# Patient Record
Sex: Female | Born: 1950 | Race: White | Hispanic: No | Marital: Single | Smoking: Never smoker
Health system: Southern US, Community
[De-identification: ages and names within clinical notes are randomized; demographics above are authoritative.]

## PROBLEM LIST (undated history)

## (undated) DIAGNOSIS — E785 Hyperlipidemia, unspecified: Secondary | ICD-10-CM

## (undated) DIAGNOSIS — T7840XA Allergy, unspecified, initial encounter: Secondary | ICD-10-CM

## (undated) DIAGNOSIS — I639 Cerebral infarction, unspecified: Secondary | ICD-10-CM

## (undated) DIAGNOSIS — S0300XA Dislocation of jaw, unspecified side, initial encounter: Secondary | ICD-10-CM

## (undated) DIAGNOSIS — R002 Palpitations: Secondary | ICD-10-CM

## (undated) DIAGNOSIS — K589 Irritable bowel syndrome without diarrhea: Secondary | ICD-10-CM

## (undated) DIAGNOSIS — I10 Essential (primary) hypertension: Secondary | ICD-10-CM

## (undated) DIAGNOSIS — E119 Type 2 diabetes mellitus without complications: Secondary | ICD-10-CM

## (undated) DIAGNOSIS — J45909 Unspecified asthma, uncomplicated: Secondary | ICD-10-CM

## (undated) DIAGNOSIS — G4700 Insomnia, unspecified: Secondary | ICD-10-CM

## (undated) DIAGNOSIS — E1059 Type 1 diabetes mellitus with other circulatory complications: Secondary | ICD-10-CM

## (undated) DIAGNOSIS — K5790 Diverticulosis of intestine, part unspecified, without perforation or abscess without bleeding: Secondary | ICD-10-CM

## (undated) DIAGNOSIS — E1159 Type 2 diabetes mellitus with other circulatory complications: Secondary | ICD-10-CM

## (undated) DIAGNOSIS — N200 Calculus of kidney: Secondary | ICD-10-CM

## (undated) DIAGNOSIS — G43909 Migraine, unspecified, not intractable, without status migrainosus: Secondary | ICD-10-CM

## (undated) DIAGNOSIS — E669 Obesity, unspecified: Secondary | ICD-10-CM

## (undated) HISTORY — DX: Dislocation of jaw, unspecified side, initial encounter: S03.00XA

## (undated) HISTORY — PX: COLON SURGERY: SHX602

## (undated) HISTORY — DX: Insomnia, unspecified: G47.00

## (undated) HISTORY — DX: Irritable bowel syndrome without diarrhea: K58.9

## (undated) HISTORY — DX: Calculus of kidney: N20.0

## (undated) HISTORY — PX: VESICOVAGINAL FISTULA CLOSURE W/ TAH: SUR271

## (undated) HISTORY — DX: Unspecified asthma, uncomplicated: J45.909

## (undated) HISTORY — PX: APPENDECTOMY: SHX54

## (undated) HISTORY — DX: Type 2 diabetes mellitus without complications: E11.9

## (undated) HISTORY — DX: Type 2 diabetes mellitus with other circulatory complications: E11.59

## (undated) HISTORY — DX: Cerebral infarction, unspecified: I63.9

## (undated) HISTORY — PX: ROTATOR CUFF REPAIR: SHX139

## (undated) HISTORY — DX: Diverticulosis of intestine, part unspecified, without perforation or abscess without bleeding: K57.90

## (undated) HISTORY — DX: Allergy, unspecified, initial encounter: T78.40XA

## (undated) HISTORY — DX: Hyperlipidemia, unspecified: E78.5

## (undated) HISTORY — PX: CHOLECYSTECTOMY: SHX55

## (undated) HISTORY — DX: Palpitations: R00.2

## (undated) HISTORY — DX: Obesity, unspecified: E66.9

## (undated) HISTORY — DX: Migraine, unspecified, not intractable, without status migrainosus: G43.909

## (undated) HISTORY — DX: Type 1 diabetes mellitus with other circulatory complications: E10.59

## (undated) HISTORY — DX: Essential (primary) hypertension: I10

---

## 1988-03-28 HISTORY — PX: ABDOMINAL HYSTERECTOMY: SHX81

## 2009-03-28 LAB — HM COLONOSCOPY: HM Colonoscopy: NORMAL

## 2010-07-07 ENCOUNTER — Other Ambulatory Visit (HOSPITAL_COMMUNITY): Payer: Self-pay | Admitting: Orthopedic Surgery

## 2010-07-07 ENCOUNTER — Ambulatory Visit (HOSPITAL_COMMUNITY)
Admission: RE | Admit: 2010-07-07 | Discharge: 2010-07-07 | Disposition: A | Discharge status: Home / Self Care | Payer: Medicare Other | Source: Ambulatory Visit | Attending: Orthopedic Surgery | Admitting: Orthopedic Surgery

## 2010-07-07 ENCOUNTER — Encounter (HOSPITAL_COMMUNITY)
Admission: RE | Admit: 2010-07-07 | Discharge: 2010-07-07 | Disposition: A | Discharge status: Home / Self Care | Payer: Medicare Other | Source: Ambulatory Visit | Attending: Orthopedic Surgery | Admitting: Orthopedic Surgery

## 2010-07-07 DIAGNOSIS — Z01812 Encounter for preprocedural laboratory examination: Secondary | ICD-10-CM | POA: Insufficient documentation

## 2010-07-07 DIAGNOSIS — Z01810 Encounter for preprocedural cardiovascular examination: Secondary | ICD-10-CM | POA: Insufficient documentation

## 2010-07-07 DIAGNOSIS — Z01818 Encounter for other preprocedural examination: Secondary | ICD-10-CM | POA: Insufficient documentation

## 2010-07-07 DIAGNOSIS — M75101 Unspecified rotator cuff tear or rupture of right shoulder, not specified as traumatic: Secondary | ICD-10-CM

## 2010-07-07 DIAGNOSIS — M719 Bursopathy, unspecified: Secondary | ICD-10-CM | POA: Insufficient documentation

## 2010-07-07 DIAGNOSIS — M67919 Unspecified disorder of synovium and tendon, unspecified shoulder: Secondary | ICD-10-CM | POA: Insufficient documentation

## 2010-07-07 LAB — BASIC METABOLIC PANEL
Calcium: 9.8 mg/dL (ref 8.4–10.5)
GFR calc Af Amer: >60 mL/min (ref >60)
GFR calc non Af Amer: >60 mL/min (ref >60)
Potassium: 4.1 mEq/L (ref 3.5–5.1)
Sodium: 138 mEq/L (ref 135–145)

## 2010-07-07 LAB — CBC
HCT: 47.2 % — ABNORMAL HIGH (ref 36.0–46.0)
Platelets: 328 10*3/uL (ref 150–400)
RDW: 14.1 % (ref 11.5–15.5)
WBC: 11 10*3/uL — ABNORMAL HIGH (ref 4.0–10.5)

## 2010-07-07 LAB — SURGICAL PCR SCREEN: Staphylococcus aureus: POSITIVE — AB

## 2010-07-16 ENCOUNTER — Observation Stay (HOSPITAL_COMMUNITY)
Admission: RE | Admit: 2010-07-16 | Discharge: 2010-07-17 | Disposition: A | Discharge status: Home / Self Care | Payer: Medicare Other | Source: Ambulatory Visit | Attending: Orthopedic Surgery | Admitting: Orthopedic Surgery

## 2010-07-16 DIAGNOSIS — Z01818 Encounter for other preprocedural examination: Secondary | ICD-10-CM | POA: Insufficient documentation

## 2010-07-16 DIAGNOSIS — M19019 Primary osteoarthritis, unspecified shoulder: Secondary | ICD-10-CM | POA: Insufficient documentation

## 2010-07-16 DIAGNOSIS — M2669 Other specified disorders of temporomandibular joint: Secondary | ICD-10-CM | POA: Insufficient documentation

## 2010-07-16 DIAGNOSIS — K219 Gastro-esophageal reflux disease without esophagitis: Secondary | ICD-10-CM | POA: Insufficient documentation

## 2010-07-16 DIAGNOSIS — E119 Type 2 diabetes mellitus without complications: Secondary | ICD-10-CM | POA: Insufficient documentation

## 2010-07-16 DIAGNOSIS — M75110 Incomplete rotator cuff tear or rupture of unspecified shoulder, not specified as traumatic: Secondary | ICD-10-CM | POA: Insufficient documentation

## 2010-07-16 DIAGNOSIS — M24119 Other articular cartilage disorders, unspecified shoulder: Principal | ICD-10-CM | POA: Insufficient documentation

## 2010-07-16 DIAGNOSIS — Z01812 Encounter for preprocedural laboratory examination: Secondary | ICD-10-CM | POA: Insufficient documentation

## 2010-07-16 DIAGNOSIS — I10 Essential (primary) hypertension: Secondary | ICD-10-CM | POA: Insufficient documentation

## 2010-07-16 DIAGNOSIS — H409 Unspecified glaucoma: Secondary | ICD-10-CM | POA: Insufficient documentation

## 2010-07-16 DIAGNOSIS — M66329 Spontaneous rupture of flexor tendons, unspecified upper arm: Secondary | ICD-10-CM | POA: Insufficient documentation

## 2010-07-16 DIAGNOSIS — J45909 Unspecified asthma, uncomplicated: Secondary | ICD-10-CM | POA: Insufficient documentation

## 2010-07-16 DIAGNOSIS — M25819 Other specified joint disorders, unspecified shoulder: Secondary | ICD-10-CM | POA: Insufficient documentation

## 2010-07-16 LAB — GLUCOSE, CAPILLARY
Glucose-Capillary: 97 mg/dL (ref 70–99)
Glucose-Capillary: 137 mg/dL — ABNORMAL HIGH (ref 70–99)
Glucose-Capillary: 112 mg/dL — ABNORMAL HIGH (ref 70–99)
Glucose-Capillary: 255 mg/dL — ABNORMAL HIGH (ref 70–99)
Glucose-Capillary: 121 mg/dL — ABNORMAL HIGH (ref 70–99)

## 2010-07-17 LAB — GLUCOSE, CAPILLARY: Glucose-Capillary: 197 mg/dL — ABNORMAL HIGH (ref 70–99)

## 2010-07-26 NOTE — Op Note (Signed)
  NAME:  Brittany Cannon, Brittany Cannon             ACCOUNT NO.:  [id number]  MEDICAL RECORD NO.:  [id number]           PATIENT TYPE:  O  LOCATION:  5032                         FACILITY:  [facility]  PHYSICIAN:  Dyke Brackett, M.D.    DATE OF BIRTH:  13-Mar-1951  DATE OF PROCEDURE:  07/16/2010 DATE OF DISCHARGE:                              OPERATIVE REPORT   INDICATIONS:  A 60 year old with significant amount of shoulder pain. MRI proven partial cuff tear, impingement, AC joint arthritis, thought to be amenable to overnight hospitalization due to medical complexity.  PREOPERATIVE DIAGNOSES:  Labral tear, partial biceps tendon tear, partial rotator cuff tear, impingement, acromioclavicular arthritis.  POSTOPERATIVE DIAGNOSES:  Labral tear, partial biceps tendon tear, partial rotator cuff tear, impingement, acromioclavicular arthritis.  OPERATIONS: 1. Arthroscopic debridement (extensive) of glenohumeral joint, biceps,     labrum, and a rotator cuff. 2. Arthroscopic acromioplasty. 3. Arthroscopic excision of distal clavicle all to the right shoulder.  SURGEON:  Dyke Brackett, MD  ASSISTANTSu Hilt, PA  General with a nerve block.  DESCRIPTION OF PROCEDURE:  Arthroscopic portals created posterolaterally and anteriorly.  Systematic inspection of the shoulder showed the patient to have probably 20% tear of the biceps tendon and its attachment debrided, undersurface of full width, partial-thickness 20% tear supraspinatus debrided, labral tearing degenerative debrided as well anterior-superior labrum.  Once the intraarticular extensive debridement was carried out, we also performed an acromioplasty, bursectomy and a distal clavicle excision requiring resection of the centimeter to centimeter half of the distal clavicle.  Shoulder drained free of fluid.  Portals closed with nylon.  Taken to recovery room in stable condition.     Dyke Brackett, M.D.     WDC/MEDQ  D:  07/16/2010  T:   07/16/2010  Job:  [id number]  Electronically Signed by W. Javona Bergevin M.D. on 07/26/2010 05:11:38 PM

## 2010-08-12 NOTE — Discharge Summary (Signed)
NAMEANJOLAOLUWA, SIGUENZA             ACCOUNT NO.:  [id number]  MEDICAL RECORD NO.:  [id number]           PATIENT TYPE:  O  LOCATION:  5032                         FACILITY:  [facility]  PHYSICIAN:  Dyke Brackett, M.D.    DATE OF BIRTH:  1950-05-25  DATE OF ADMISSION:  07/16/2010 DATE OF DISCHARGE:  07/17/2010                              DISCHARGE SUMMARY   DIAGNOSIS FOR THIS ADMISSION:  Right shoulder labral tear, partial biceps tendon tear, partial rotator cuff tear, impingement and acromioclavicular arthritis.  PROCEDURE WHILE IN HOSPITAL:  Arthroscopic debridement of glenohumeral joint, biceps labrum, and rotator cuff tear with arthroscopic acromioplasty and excision of distal clavicle.  DISCHARGE SUMMARY:  The patient is a 60 year old with significant amount of shoulder pain, MRI-proven partial rotator cuff tear, impingement, AC joint arthritis, thought to be amenable to overnight hospitalization due to the medical complexity.  The patient's preoperative medical history, as per chart from her history and physical from her cardiologist at [facility], cleared with relatively small risk for surgery. Preoperative labs including CBC, CMET, chest x-ray, EKG, PT, and PTT were within acceptable limits.  HOSPITAL COURSE:  On the date of admission, the patient was taken to the hospital room where she underwent arthroscopic debridement of glenohumeral joint with debridement of biceps labrum and partial rotator cuff tear and acromioplasty and distal clavicle excision.  She did well postoperatively and was placed on postoperative pain control with Percocet one to two every 4 hours p.r.n. pain.  Ice to the right shoulder.  Physical therapy was begun to instruct her pendulum exercises.  She was consulted with Respiratory Therapy for management of her multiple medications for her COPD and asthma.  DIET:  Advance as tolerated to her ADA diet.  ACTIVITY:  Ad lib and her sling for  comfort.  The patient's hospital course was uneventful, no signs of any respiratory difficulty.  The following morning, she was felt to be stable without any significant difficulty medically and orthopedically. She was discharged home in the care of her family with the following discharge medications with: 1. Albuterol nebulizer 1.25 mg inhaled every 8 hours as needed. 2. Verapamil SR 180 mg one capsule by mouth daily. 3. Albuterol inhaler 2 puffs inhaled every 4 hours as needed. 4. Sumatriptan 25 mg one tablet by mouth every 2 hours as needed, not     to exceed 2 doses in 24 hours for headache. 5. Plavix 75 mg one tablet by mouth daily. 6. Pantoprazole 40 mg one p.o. by mouth daily. 7. Metoprolol XL 25 mg one p.o. daily. 8. Meclizine 25 mg one p.o. daily nightly as needed. 9. Pitavastatin 4 mg one p.o. nightly. 10.Levocetirizine 5 mg one tablet by mouth daily. 11.Lantus insulin 56 mg subcu every morning. 12.Humalog insulin sliding scale twice daily as needed per sliding     scale. 13.Dulera 200/5 mcg two puffs inhaled twice daily. 14.Dicyclomine 20 mg one p.o. t.i.d. as needed. 15.Clotrimazole 1-0.05% topical as needed for yeast infections     topically. 16.Bacitracin ophthalmic ointment in both eyes daily nightly. 17.Albuterol nebulization 2.5 mg every 6 hours as needed. 18.Albuterol 4 mg one-half to  one tablet by mouth three times a day. 19.Actos 15 mg one p.o. daily. 20.Valtrex 1 tablet by mouth three times daily as needed. Prescriptions were given for Percocet 1-2 pills every 4-6 hours p.r.n. pain and Robaxin 500 mg one p.o. q.8 h. as needed for spasm.  She should return to see Dr. Madelon Lips in 7-10 days' time calling for appointment. Continue with her home exercise program with pendulum.  Dressing changes daily or every other day as needed.  Keep wound clean, dry, and covered. Return sooner should she have any increase in temperature greater than 101, any increase in pain that  is not well controlled by pain medication, or any redness or streaking in the arm.  Temperature over 101, of course, return as well.  Diet, return to regular preoperative diet.  Activity, per physical therapy.     Laural Benes. Jannet Mantis   ______________________________ Dyke Brackett, M.D.    JBR/MEDQ  D:  07/28/2010  T:  07/29/2010  Job:  [id number]  Electronically Signed by Dan Humphreys P.A. on 08/09/2010 01:32:24 PM Electronically Signed by Lacretia Nicks. Alphonza Tramell M.D. on 08/12/2010 11:11:26 AM

## 2010-09-16 ENCOUNTER — Other Ambulatory Visit: Payer: Self-pay | Admitting: Orthopedic Surgery

## 2010-09-16 DIAGNOSIS — M25511 Pain in right shoulder: Secondary | ICD-10-CM

## 2010-09-23 ENCOUNTER — Ambulatory Visit
Admission: RE | Admit: 2010-09-23 | Discharge: 2010-09-23 | Disposition: A | Discharge status: Home / Self Care | Payer: Medicare Other | Source: Ambulatory Visit | Attending: Orthopedic Surgery | Admitting: Orthopedic Surgery

## 2010-09-23 DIAGNOSIS — M25511 Pain in right shoulder: Secondary | ICD-10-CM

## 2010-12-17 ENCOUNTER — Ambulatory Visit (HOSPITAL_BASED_OUTPATIENT_CLINIC_OR_DEPARTMENT_OTHER): Admission: RE | Admit: 2010-12-17 | Payer: Medicare Other | Source: Ambulatory Visit | Admitting: Orthopedic Surgery

## 2011-01-21 ENCOUNTER — Ambulatory Visit (HOSPITAL_BASED_OUTPATIENT_CLINIC_OR_DEPARTMENT_OTHER)
Admission: RE | Admit: 2011-01-21 | Discharge: 2011-01-21 | Disposition: A | Discharge status: Home / Self Care | Payer: Medicare Other | Source: Ambulatory Visit | Attending: Orthopedic Surgery | Admitting: Orthopedic Surgery

## 2011-01-21 DIAGNOSIS — E669 Obesity, unspecified: Secondary | ICD-10-CM | POA: Insufficient documentation

## 2011-01-21 DIAGNOSIS — I10 Essential (primary) hypertension: Secondary | ICD-10-CM | POA: Insufficient documentation

## 2011-01-21 DIAGNOSIS — I2510 Atherosclerotic heart disease of native coronary artery without angina pectoris: Secondary | ICD-10-CM | POA: Insufficient documentation

## 2011-01-21 DIAGNOSIS — E119 Type 2 diabetes mellitus without complications: Secondary | ICD-10-CM | POA: Insufficient documentation

## 2011-01-21 DIAGNOSIS — M719 Bursopathy, unspecified: Secondary | ICD-10-CM | POA: Insufficient documentation

## 2011-01-21 DIAGNOSIS — M67919 Unspecified disorder of synovium and tendon, unspecified shoulder: Secondary | ICD-10-CM | POA: Insufficient documentation

## 2011-01-21 LAB — GLUCOSE, CAPILLARY
Glucose-Capillary: 233 mg/dL — ABNORMAL HIGH (ref 70–99)
Glucose-Capillary: 209 mg/dL — ABNORMAL HIGH (ref 70–99)

## 2011-01-24 LAB — BODY FLUID CULTURE: Culture: NO GROWTH

## 2011-01-26 LAB — POCT I-STAT, CHEM 8
Chloride: 106 mEq/L (ref 96–112)
Creatinine, Ser: 0.6 mg/dL (ref 0.50–1.10)
Glucose, Bld: 268 mg/dL — ABNORMAL HIGH (ref 70–99)
HCT: 48 % — ABNORMAL HIGH (ref 36.0–46.0)
Hemoglobin: 16.3 g/dL — ABNORMAL HIGH (ref 12.0–15.0)
Potassium: 3.5 mEq/L (ref 3.5–5.1)
Sodium: 140 mEq/L (ref 135–145)

## 2011-01-26 NOTE — Op Note (Signed)
NAME:  Brittany Cannon, Brittany Cannon NO.:  [id number]  MEDICAL RECORD NO.:  [id number]  LOCATION:                                 FACILITY:  PHYSICIAN:  Eulas Post, MD    DATE OF BIRTH:  Aug 24, 1950  DATE OF PROCEDURE:  01/21/2011 DATE OF DISCHARGE:                              OPERATIVE REPORT   ATTENDING SURGEON:  Eulas Post, MD  FIRST ASSISTANT:  Janace Litten, orthopedic PA-C  PREOPERATIVE DIAGNOSES:  Right shoulder pain, failed previous shoulder arthroscopy, question rotator cuff tear, and biceps tendinosis.  POSTOPERATIVE DIAGNOSES:  Right shoulder high grade tendinopathy with near full-thickness tear of the junction between the infraspinatus and supraspinatus with bicipital tendinosis and superior labral tear.  PROCEDURES:  Right shoulder arthroscopy with rotator cuff repair and biceps tenolysis with aspiration of shoulder joint under arthroscopic guidance.  PREOPERATIVE INDICATIONS:  [name] is a 60 year old woman who had a previous shoulder arthroscopy and complained of ongoing severe pain.  She never recovered from her first operation and continued to have fairly miserable symptoms.  Preoperative workup for infection was negative, however, I was still concerned given the severity of symptoms and relative paucity of findings on her MRI.  The risks, benefits, and alternatives to revision surgical intervention was discussed with her including but not limited to the risks of infection, bleeding, nerve injury, recurrent rotator cuff tear, progression of rotator cuff disease, progression of potential arthritis, stiffness, loss of function, incomplete relief of pain, cardiopulmonary complications, among others and she is willing to proceed.  OPERATIVE FINDINGS:  The glenohumeral articular cartilage was normal. There was not a significant effusion, and I did aspirate fluid, total of about 2 L of normal appearing joint fluid.  This was done under  direct visualization using the arthroscope, because aspiration prior to inserting the arthroscope did not yield any fluid.  The superior labrum was well anchored to the glenoid, but there was broadening and abnormal quality of tissue at the superior biceps anchor. The biceps itself also did have some tendinosis.  The pulley was intact, and the subscapularis was also intact.  The supraspinatus at the leading edge was intact, but more posteriorly had substantial tendinosis, and high-grade tendinopathy.  This was seen both from the articular side as well as from the bursal side.  Therefore, this location of weak tendon is where I performed a repair.  Her exam under anesthesia demonstrated full motion.  OPERATIVE PROCEDURE:  The patient was brought to the operating room, placed in supine position.  IV antibiotics were given.  General anesthesia was administered.  Regional block had already been given. She was turned into the semilateral decubitus position and all bony prominences padded.  The right upper extremity was prepped and draped in usual sterile fashion.  Diagnostic arthroscopy was carried out with the above-named findings.  Prior to initiating any water inflow, I did place a needle through the rotator interval and then sent the joint fluid for Gram stain, culture, sensitivity, cell count, analysis, and culture for 2 weeks, growing for Propionibacterium acnes.  I did perform a biceps tenolysis, as I wanted to remove any potential pain generators from her shoulder given  her extensive pain history.  I had talked with her about this ahead of time and we agreed upon this as a part of the plan.  I debrided the undersurface of the rotator cuff, and probed it with a spinal needle, and it was found to be very thin and have very poor substance.  I tagged this with a Prolene suture.  I then went into subacromial space.  Bursectomy was not particularly necessary, because this had already  been done.  There also had been what appeared to be a appropriate acromioplasty, so I did not bur anything off of the acromion.  I found a fairly substantial tendinopathy of the infraspinatus and supraspinatus, which I took down with the ArthroCare wand.  This basically left a split in the tendon, and I placed a cannula.  I also repaired the bed of the greater tuberosity with a small bur.  I placed an inverted fiber tape with 1 arm in each of the limbs of the suture and closed down the V and repaired the tendon back to bone. This provided excellent fixation.  The tendon was actually fairly thick, and I augmented it with the #2 FiberWire that was inside of the anchor passing this through the portion of the cuff that was brought laterally. This provided extra fixation.  I then cut all the sutures, and irrigated the shoulder and inspected the previous acromioplasty which appeared to be adequate and so then injected the shoulder with Marcaine and closed the portals with Monocryl followed by Steri-Strips and sterile gauze. She was awakened and returned to PACU in stable and satisfactory condition.  Janace Litten, orthopedic PA-C was present and scrubbed throughout the case assisting with positioning as well as instrumentation and closure.  There were no complications.     Eulas Post, MD     JPL/MEDQ  D:  01/21/2011  T:  01/21/2011  Job:  [id number]  Electronically Signed by Teryl Lucy MD on 01/26/2011 09:23:46 AM

## 2011-03-29 LAB — HM PAP SMEAR: HM Pap smear: NORMAL

## 2011-03-29 LAB — HM MAMMOGRAPHY

## 2012-09-27 ENCOUNTER — Encounter: Payer: Self-pay | Admitting: Internal Medicine

## 2012-09-27 ENCOUNTER — Ambulatory Visit (INDEPENDENT_AMBULATORY_CARE_PROVIDER_SITE_OTHER): Payer: Medicare Other | Admitting: Internal Medicine

## 2012-09-27 VITALS — BP 124/84 | HR 106 | Temp 98.1°F | Ht 68.0 in | Wt 225.0 lb

## 2012-09-27 DIAGNOSIS — J45909 Unspecified asthma, uncomplicated: Secondary | ICD-10-CM

## 2012-09-27 DIAGNOSIS — R05 Cough: Secondary | ICD-10-CM

## 2012-09-27 DIAGNOSIS — I10 Essential (primary) hypertension: Secondary | ICD-10-CM

## 2012-09-27 DIAGNOSIS — R059 Cough, unspecified: Secondary | ICD-10-CM

## 2012-09-27 MED ORDER — TRAMADOL HCL 50 MG PO TABS: ORAL_TABLET | ORAL | Status: DC

## 2012-09-27 MED ORDER — BUDESONIDE-FORMOTEROL FUMARATE 80-4.5 MCG/ACT IN AERO: INHALATION_SPRAY | RESPIRATORY_TRACT | Status: DC

## 2012-09-27 MED ORDER — PREDNISONE (PAK) 10 MG PO TABS: ORAL_TABLET | ORAL | Status: DC

## 2012-09-27 NOTE — Progress Notes (Signed)
  Subjective:    Patient ID: Brittany Cannon, female    DOB: 12/01/1950  MRN: [id number]  HPI  62 yowf never smoker with asthma at age 62 " always on some kind of inhaler" ( typically on some form of albuterol)  On adaily basis last good control Apirl 2013 referred 09/27/2012 to pulmonary clinic by Dr Georgena Spurling for dtc asthma  09/27/2012 1st pulmonary eval on ACEi  cc > 1 year hx of poor control of chest tightness with sob and dry cough with sob room to room despite advair has tried to dulera and "only thing that makes it better is 30 days of medrol" assoc with intermittent sore thoat and dysphagia.  Sob x room to room. Hacking cough to point of clutching her chest from soreness brought on by cough  No obvious pattern to daytime variabilty or purulent sputumt production or chest tightness, subjective wheeze overt sinus or hb symptoms. No unusual exp hx or h/o childhood pna/ asthma or knowledge of premature birth.   Review of Systems  Constitutional: Negative for fever, chills and unexpected weight change.  HENT: Positive for congestion, sore throat, sneezing and trouble swallowing. Negative for ear pain, nosebleeds, rhinorrhea, dental problem, voice change, postnasal drip and sinus pressure.   Eyes: Negative for visual disturbance.  Respiratory: Positive for cough and shortness of breath. Negative for choking.   Cardiovascular: Positive for chest pain. Negative for leg swelling.  Gastrointestinal: Negative for vomiting, abdominal pain and diarrhea.  Genitourinary: Negative for difficulty urinating.       Heart Burn  Musculoskeletal: Negative for arthralgias.  Skin: Negative for rash.  Neurological: Positive for headaches. Negative for tremors and syncope.  Hematological: Does not bruise/bleed easily.       Objective:   Physical Exam  amb anxious wf with very little insight into meds  Wt Readings from Last 3 Encounters:  09/27/12 225 lb (102.059 kg)     HEENT: nl dentition, turbinates,  and orophanx. Nl external ear canals without cough reflex   NECK :  without JVD/Nodes/TM/ nl carotid upstrokes bilaterally   LUNGS: no acc muscle use, clear to A and P bilaterally without cough on insp or exp maneuvers   CV:  RRR  no s3 or murmur or increase in P2, no edema   ABD:  soft and nontender with nl excursion in the supine position. No bruits or organomegaly, bowel sounds nl  MS:  warm without deformities, calf tenderness, cyanosis or clubbing  SKIN: warm and dry without lesions    NEURO:  alert, approp, no deficits    CXR report 08/31/12 wnl       Assessment & Plan:

## 2012-09-27 NOTE — Patient Instructions (Addendum)
Stop lisinopril   Stop advair  Symbicort 80 Take 2 puffs first thing in am and then another 2 puffs about 12 hours later and work on smooth deep breath.   Take delsym two tsp every 12 hours and supplement if needed with  tramadol 50 mg up to 2 every 4 hours to suppress the urge to cough. Swallowing water or using ice chips/non mint and menthol containing candies (such as lifesavers or sugarless jolly ranchers) are also effective.  You should rest your voice and avoid activities that you know make you cough.  Once you have eliminated the cough for 3 straight days try reducing the tramadol first,  then the delsym as tolerated.    Prilosec 20 mg Take 30- 60 min before your first and last meals of the day   For chest pain take the tramadol as you do for the cough - up to 2 every 4 hours  See Tammy NP w/in 1 weeks with all your medications, even over the counter meds, separated in two separate bags, the ones you take no matter what vs the ones you stop once you feel better and take only as needed when you feel you need them.   Tammy  will generate for you a new user friendly medication calendar that will put [place] all on the same page re: your medication use.     Without this process, it simply isn't possible to assure that we are providing  your outpatient care  with  the attention to detail we feel you deserve.   If we cannot assure that you're getting that kind of care,  then we cannot manage your problem effectively from this clinic.  Once you have seen Tammy and we are sure that we're all on the same page with your medication use she will arrange follow up with me.

## 2012-09-30 DIAGNOSIS — R05 Cough: Secondary | ICD-10-CM | POA: Insufficient documentation

## 2012-09-30 DIAGNOSIS — J45909 Unspecified asthma, uncomplicated: Secondary | ICD-10-CM | POA: Insufficient documentation

## 2012-09-30 DIAGNOSIS — R059 Cough, unspecified: Secondary | ICD-10-CM | POA: Insufficient documentation

## 2012-09-30 DIAGNOSIS — I10 Essential (primary) hypertension: Secondary | ICD-10-CM | POA: Insufficient documentation

## 2012-09-30 NOTE — Assessment & Plan Note (Signed)
DDX of  difficult airways managment all start with A and  include Adherence, Ace Inhibitors, Acid Reflux, Active Sinus Disease, Alpha 1 Antitripsin deficiency, Anxiety masquerading as Airways dz,  ABPA,  allergy(esp in young), Aspiration (esp in elderly), Adverse effects of DPI,  Active smokers, plus two Bs  = Bronchiectasis and Beta blocker use..and one C= CHF  In this case Adherence is the biggest issue and starts with  inability to use HFA effectively and also  understand that SABA treats the symptoms but doesn't get to the underlying problem (inflammation).  I used  the analogy of putting steroid cream on a rash to help explain the meaning of topical therapy and the need to get the drug to the target tissue.     The proper method of use, as well as anticipated side effects, of a metered-dose inhaler are discussed and demonstrated to the patient. Improved effectiveness after extensive coaching during this visit to a level of approximately  75%   For now try off advair since probably contributing to cough > try symbicort 80 Take 2 puffs first thing in am and then another 2 puffs about 12 hours later then regroup with complete and accurate med reconciliation   To keep things simple, I have asked the patient to first separate medicines that are perceived as maintenance, that is to be taken daily "no matter what", from those medicines that are taken on only on an as-needed basis and I have given the patient examples of both, and then return to see our NP to generate a  detailed  medication calendar which should be followed until the next physician sees the patient and updates it.

## 2012-09-30 NOTE — Assessment & Plan Note (Signed)

## 2012-09-30 NOTE — Assessment & Plan Note (Addendum)
The most common causes of chronic cough in immunocompetent adults include the following: upper airway cough syndrome (UACS), previously referred to as postnasal drip syndrome (PNDS), which is caused by variety of rhinosinus conditions; (2) asthma; (3) GERD; (4) chronic bronchitis from cigarette smoking or other inhaled environmental irritants; (5) nonasthmatic eosinophilic bronchitis; and (6) bronchiectasis.   These conditions, singly or in combination, have accounted for up to 94% of the causes of chronic cough in prospective studies.   Other conditions have constituted no >6% of the causes in prospective studies These have included bronchogenic carcinoma, chronic interstitial pneumonia, sarcoidosis, left ventricular failure, ACEI-induced cough, and aspiration from a condition associated with pharyngeal dysfunction.    Chronic cough is often simultaneously caused by more than one condition. A single cause has been found from 38 to 82% of the time, multiple causes from 18 to 62%. Multiply caused cough has been the result of three diseases up to 42% of the time.       Most likely this there is at least a component here of   Classic Upper airway cough syndrome, so named because it's frequently impossible to sort out how much is  CR/sinusitis with freq throat clearing (which can be related to primary GERD)   vs  causing  secondary (" extra esophageal")  GERD from wide swings in gastric pressure that occur with throat clearing, often  promoting self use of mint and menthol lozenges that reduce the lower esophageal sphincter tone and exacerbate the problem further in a cyclical fashion.   These are the same pts (now being labeled as having "irritable larynx syndrome" by some cough centers) who not infrequently have a history of having failed to tolerate ace inhibitors,  dry powder inhalers like advair esp if used in combination with acei, which is the case here, or biphosphonates or report having atypical  reflux symptoms that don't respond to standard doses of PPI , and are easily confused as having aecopd or asthma flares by even experienced allergists/ pulmonologists.   rec trial off acei and advair then regroup to see to what extent if any she also has cough variant asthma.

## 2012-10-01 ENCOUNTER — Telehealth: Payer: Self-pay | Admitting: Internal Medicine

## 2012-10-01 NOTE — Telephone Encounter (Signed)
Spoke with pt and she reports that cough is no better since ov.  Reviewed Dr Thurston Hole specific instructions from ov and pt states that she is following instructions exactly.  Cough is same even with Delsym and Tramadol 2 tabs every 4 hrs.  Pt given appt with Dr Sherene Sires tomorrow.

## 2012-10-02 ENCOUNTER — Encounter: Payer: Self-pay | Admitting: Internal Medicine

## 2012-10-02 ENCOUNTER — Ambulatory Visit (INDEPENDENT_AMBULATORY_CARE_PROVIDER_SITE_OTHER): Payer: Medicare Other | Admitting: Internal Medicine

## 2012-10-02 VITALS — BP 118/74 | HR 92 | Temp 97.2°F | Ht 69.0 in | Wt 228.0 lb

## 2012-10-02 DIAGNOSIS — J45909 Unspecified asthma, uncomplicated: Secondary | ICD-10-CM

## 2012-10-02 DIAGNOSIS — R05 Cough: Secondary | ICD-10-CM

## 2012-10-02 MED ORDER — FLUTTER DEVI: Status: AC

## 2012-10-02 MED ORDER — TRAMADOL HCL 50 MG PO TABS: ORAL_TABLET | ORAL | Status: DC

## 2012-10-02 MED ORDER — DOXYCYCLINE HYCLATE 100 MG PO TABS: 100.0000 mg | ORAL_TABLET | Freq: Two times a day (BID) | ORAL | Status: DC

## 2012-10-02 NOTE — Patient Instructions (Addendum)
Stop all inhalers and just use the xopenex neb every 4 hours if can't catch your breath  Resume lyrica three times a day  Doxycycline 100 mg twice daily x 10 days  Take delsym two tsp every 12 hours and supplement if needed with  tramadol 50 mg up to 2 every 4 hours to suppress the urge to cough. Swallowing water or using ice chips/non mint and menthol containing candies (such as lifesavers or sugarless jolly ranchers) are also effective.  You should rest your voice and avoid activities that you know make you cough.  Once you have eliminated the cough for 3 straight days try reducing the tramadol first,  then the delsym as tolerated.    Use Flutter valve to keep you from hurting yourself during cough attacks  Keep appt to see Brittany Cannon with all active medications in hand

## 2012-10-02 NOTE — Progress Notes (Signed)
Subjective:    Patient ID: Brittany Cannon, female    DOB: [birth year]  MRN: [id number]  HPI  62 yowf never smoker with asthma at age 62 " always on some kind of inhaler" ( typically on some form of albuterol)  On a daily basis last good control Apirl 2013 referred 09/27/2012 to pulmonary clinic by Dr Georgena Spurling for dtc asthma  09/27/2012 1st pulmonary eval on ACEi  cc > 1 year hx of poor control of chest tightness with sob and dry cough with sob room to room despite advair has tried to dulera and "only thing that makes it better is 30 days of medrol" assoc with intermittent sore thoat and dysphagia.    Hacking cough to point of clutching her chest from soreness brought on by cough. rec Stop lisinopril  Stop advair Symbicort 80 Take 2 puffs first thing in am and then another 2 puffs about 12 hours later and work on smooth deep breath.  Take delsym two tsp every 12 hours and supplement if needed with  tramadol 50 mg up to 2 every 4 hours to suppress the urge to cough. Swallowing water or using ice chips/non mint and menthol containing candies (such as lifesavers or sugarless jolly ranchers) are also effective.  You should rest your voice and avoid activities that you know make you cough. Once you have eliminated the cough for 3 straight days try reducing the tramadol first,  then the delsym as tolerated.   Prilosec 20 mg Take 30- 60 min before your first and last meals of the day  For chest pain take the tramadol as you do for the cough - up to 2 every 4 hours    10/02/2012 f/u ov/Naketa Daddario re cough Chief Complaint  Patient presents with  . Acute Visit    Pt c/o increased cough x 2 days- prod with moderate green sputum.   not clear following any of the instructions given so far, has not pushed tramadol hard enough to eliminate barking.   No obvious pattern to daytime variabilty or chest tightness, subjective wheeze overt sinus or hb symptoms. No unusual exp hx or h/o childhood pna/ asthma or knowledge  of premature birth.   Sleeping ok without nocturnal  or early am exacerbation  of respiratory  c/o's or need for noct saba. Also denies any obvious fluctuation of symptoms with weather or environmental changes or other aggravating or alleviating factors except as outlined above  Current Medications, Allergies, Past Medical History, Past Surgical History, Family History, and Social History were reviewed in [employer] record.  ROS  The following are not active complaints unless bolded sore throat, dysphagia, dental problems, itching, sneezing,  nasal congestion or excess/ purulent secretions, ear ache,   fever, chills, sweats, unintended wt loss, pleuritic or exertional cp, hemoptysis,  orthopnea pnd or leg swelling, presyncope, palpitations, heartburn, abdominal pain, anorexia, nausea, vomiting, diarrhea  or change in bowel or urinary habits, change in stools or urine, dysuria,hematuria,  rash, arthralgias, visual complaints, headache, numbness weakness or ataxia or problems with walking or coordination,  change in mood/affect or memory.           Objective:   Physical Exam  amb anxious wf with barking dry cough  Wt Readings from Last 3 Encounters:  10/02/12 228 lb (103.42 kg)  09/27/12 225 lb (102.059 kg)       HEENT: nl dentition, turbinates, and orophanx. Nl external ear canals without cough reflex   NECK :  without JVD/Nodes/TM/ nl carotid upstrokes bilaterally   LUNGS: no acc muscle use, clear to A and P bilaterally without cough on insp or exp maneuvers   CV:  RRR  no s3 or murmur or increase in P2, no edema   ABD:  soft and nontender with nl excursion in the supine position. No bruits or organomegaly, bowel sounds nl  MS:  warm without deformities, calf tenderness, cyanosis or clubbing  SKIN: warm and dry without lesions        CXR  10/02/2012 :   Did not go as requested      Assessment & Plan:

## 2012-10-03 NOTE — Assessment & Plan Note (Signed)
-   trial off acei started 09/27/12   Still strongly suspect  Classic Upper airway cough syndrome, so named because it's frequently impossible to sort out how much is  CR/sinusitis with freq throat clearing (which can be related to primary GERD)   vs  causing  secondary (" extra esophageal")  GERD from wide swings in gastric pressure that occur with throat clearing, often  promoting self use of mint and menthol lozenges that reduce the lower esophageal sphincter tone and exacerbate the problem further in a cyclical fashion.   These are the same pts (now being labeled as having "irritable larynx syndrome" by some cough centers) who not infrequently have a history of having failed to tolerate ace inhibitors,  dry powder inhalers or biphosphonates or report having atypical reflux symptoms that don't respond to standard doses of PPI , and are easily confused as having aecopd or asthma flares by even experienced allergists/ pulmonologists.   Try to eliminate cyclical cough with tramadol/ flutter. rx with doxy for ? Sinus dz

## 2012-10-03 NOTE — Assessment & Plan Note (Signed)
Change to neb only for now as most mdi's are likely to aggravate cough    Each maintenance medication was reviewed in detail including most importantly the difference between maintenance and as needed and under what circumstances the prns are to be used.  Please see instructions for details which were reviewed in writing and the patient given a copy.

## 2012-10-15 ENCOUNTER — Institutional Professional Consult (permissible substitution): Payer: Medicare Other | Admitting: Pulmonary Disease

## 2012-10-26 ENCOUNTER — Ambulatory Visit (INDEPENDENT_AMBULATORY_CARE_PROVIDER_SITE_OTHER): Payer: Self-pay | Admitting: Adult Health

## 2012-10-26 ENCOUNTER — Encounter: Payer: Self-pay | Admitting: Adult Health

## 2012-10-26 VITALS — BP 128/72 | HR 79 | Temp 97.1°F | Ht 69.0 in | Wt 229.8 lb

## 2012-10-26 DIAGNOSIS — R05 Cough: Secondary | ICD-10-CM

## 2012-10-26 NOTE — Progress Notes (Signed)
Subjective:    Patient ID: Brittany Cannon, female    DOB: May 02, 1950  MRN: [id number]  HPI  62 yowf never smoker with asthma at age 62 " always on some kind of inhaler" ( typically on some form of albuterol)  On a daily basis last good control Apirl 2013 referred 09/27/2012 to pulmonary clinic by Dr Georgena Spurling for dtc asthma  09/27/2012 1st pulmonary eval on ACEi  cc > 1 year hx of poor control of chest tightness with sob and dry cough with sob room to room despite advair has tried to dulera and "only thing that makes it better is 30 days of medrol" assoc with intermittent sore thoat and dysphagia.    Hacking cough to point of clutching her chest from soreness brought on by cough. rec Stop lisinopril  Stop advair Symbicort 80 Take 2 puffs first thing in am and then another 2 puffs about 12 hours later and work on smooth deep breath.  Take delsym two tsp every 12 hours and supplement if needed with  tramadol 50 mg up to 2 every 4 hours to suppress the urge to cough. Swallowing water or using ice chips/non mint and menthol containing candies (such as lifesavers or sugarless jolly ranchers) are also effective.  You should rest your voice and avoid activities that you know make you cough. Once you have eliminated the cough for 3 straight days try reducing the tramadol first,  then the delsym as tolerated.   Prilosec 20 mg Take 30- 60 min before your first and last meals of the day  For chest pain take the tramadol as you do for the cough - up to 2 every 4 hours    10/02/2012 f/u ov/Wert re cough Chief Complaint  Patient presents with  . Acute Visit    Pt c/o increased cough x 2 days- prod with moderate green sputum.   not clear following any of the instructions given so far, has not pushed tramadol hard enough to eliminate barking. >lyrica Three times a day  rx   10/26/2012 follow up and med review Returns for follow up and med review  - pt brought all meds with her today. We reviewed all her  medications and organized them into a medication calendar with patient education. Appears the patient is taking her medications correctly.   pt reports cough is 60% improved since last ov Patient was taken off her ACE inhibitor and Lyrica was increased up to 3 times a day. Patient denies any hemoptysis, orthopnea, PND, or increased leg swelling  Current Medications, Allergies, Past Medical History, Past Surgical History, Family History, and Social History were reviewed in [employer] record.  ROS  The following are not active complaints unless bolded sore throat, dysphagia, dental problems, itching, sneezing,  nasal congestion or excess/ purulent secretions, ear ache,   fever, chills, sweats, unintended wt loss, pleuritic or exertional cp, hemoptysis,  orthopnea pnd or leg swelling, presyncope, palpitations, heartburn, abdominal pain, anorexia, nausea, vomiting, diarrhea  or change in bowel or urinary habits, change in stools or urine, dysuria,hematuria,  rash, arthralgias, visual complaints, headache, numbness weakness or ataxia or problems with walking or coordination,  change in mood/affect or memory.           Objective:   Physical Exam      HEENT: nl dentition, turbinates, and orophanx. Nl external ear canals without cough reflex   NECK :  without JVD/Nodes/TM/ nl carotid upstrokes bilaterally   LUNGS: no acc  muscle use, clear to A and P bilaterally without cough on insp or exp maneuvers   CV:  RRR  no s3 or murmur or increase in P2, no edema   ABD:  soft and nontender with nl excursion in the supine position. No bruits or organomegaly, bowel sounds nl  MS:  warm without deformities, calf tenderness, cyanosis or clubbing  SKIN: warm and dry without lesions        CXR  10/02/2012 :   Did not go as requested      Assessment & Plan:

## 2012-10-26 NOTE — Patient Instructions (Addendum)
Follow med calendar closely and bring to each visit.  follow up Dr. Sherene Sires  In 4 weeks with PFT  Please contact office for sooner follow up if symptoms do not improve or worsen or seek emergency care

## 2012-10-31 NOTE — Assessment & Plan Note (Signed)
Improving off ACE  Patient's medications were reviewed today and patient education was given. Computerized medication calendar was adjusted/completed  Plan  Follow med calendar closely and bring to each visit.  follow up Dr. Sherene Sires  In 4 weeks with PFT  Please contact office for sooner follow up if symptoms do not improve or worsen or seek emergency care

## 2012-11-29 ENCOUNTER — Ambulatory Visit: Payer: Medicare Other | Admitting: Internal Medicine

## 2012-12-24 ENCOUNTER — Encounter: Payer: Self-pay | Admitting: Physician Assistant

## 2012-12-24 ENCOUNTER — Ambulatory Visit (INDEPENDENT_AMBULATORY_CARE_PROVIDER_SITE_OTHER): Payer: Medicare Other | Admitting: Physician Assistant

## 2012-12-24 VITALS — BP 128/78 | HR 100 | Temp 98.6°F | Resp 16 | Ht 68.0 in | Wt 214.0 lb

## 2012-12-24 DIAGNOSIS — R8290 Unspecified abnormal findings in urine: Secondary | ICD-10-CM

## 2012-12-24 DIAGNOSIS — N23 Unspecified renal colic: Secondary | ICD-10-CM

## 2012-12-24 DIAGNOSIS — R309 Painful micturition, unspecified: Secondary | ICD-10-CM

## 2012-12-24 DIAGNOSIS — R5383 Other fatigue: Secondary | ICD-10-CM

## 2012-12-24 DIAGNOSIS — R5381 Other malaise: Secondary | ICD-10-CM | POA: Insufficient documentation

## 2012-12-24 DIAGNOSIS — R300 Dysuria: Secondary | ICD-10-CM | POA: Insufficient documentation

## 2012-12-24 DIAGNOSIS — R82998 Other abnormal findings in urine: Secondary | ICD-10-CM

## 2012-12-24 DIAGNOSIS — M545 Low back pain: Secondary | ICD-10-CM

## 2012-12-24 LAB — POCT URINALYSIS DIPSTICK
Leukocytes, UA: NEGATIVE
Nitrite, UA: NEGATIVE
Urobilinogen, UA: 0.2

## 2012-12-24 LAB — CBC WITH DIFFERENTIAL/PLATELET
Eosinophils Absolute: 0.3 10*3/uL (ref 0.0–0.7)
Hemoglobin: 14.7 g/dL (ref 12.0–15.0)
Lymphocytes Relative: 26 % (ref 12–46)
Lymphs Abs: 1.9 10*3/uL (ref 0.7–4.0)
MCH: 28.5 pg (ref 26.0–34.0)
Neutro Abs: 4.6 10*3/uL (ref 1.7–7.7)
Neutrophils Relative %: 61 % (ref 43–77)
Platelets: 347 10*3/uL (ref 150–400)
RBC: 5.15 MIL/uL — ABNORMAL HIGH (ref 3.87–5.11)
WBC: 7.4 10*3/uL (ref 4.0–10.5)

## 2012-12-24 LAB — COMPREHENSIVE METABOLIC PANEL
ALT: 9 U/L (ref 0–35)
AST: 14 U/L (ref 0–37)
Calcium: 9.3 mg/dL (ref 8.4–10.5)
Chloride: 104 mEq/L (ref 96–112)
Creat: 0.58 mg/dL (ref 0.50–1.10)
Sodium: 141 mEq/L (ref 135–145)
Total Protein: 6.6 g/dL (ref 6.0–8.3)

## 2012-12-24 MED ORDER — NITROFURANTOIN MONOHYD MACRO 100 MG PO CAPS: 100.0000 mg | ORAL_CAPSULE | Freq: Two times a day (BID) | ORAL | Status: DC

## 2012-12-24 NOTE — Assessment & Plan Note (Signed)
Possibly due to combination of urinary symptoms and chronic low back pain.  Will obtain CBC, CMP, TSH.

## 2012-12-24 NOTE — Progress Notes (Signed)
Patient ID: Brittany Cannon, female   DOB: [birth year], 62 y.o.   MRN: [id number]  Patient presents to clinic today c/o urinary urgency, frequency, dysuria, nausea that has been present for the past couple of weeks.  Patient states she was seen at [facility] about 1 week ago and was given Bacrtim DS Rx and AZO for UTI.  Patient endorses history of kidney stones and states she had a CT perfromed while in the hospital that was negative for stone or abnormality.  Patient does not have hospital records with her.  States symptoms improved slightly but have persisted.  Endorses low back pain, but states she has chronic LBP 2/2 slipped disc.  Patient also endorses fatigue over the past few weeks.  Denies fever, chills.  Endorses sweats secondary to pain.  Has lost weight but has been dieting and exercising.  Denies history of thyroid disorder.  Has DM that has not been well-controlled.  Endorses history of anemia in the past.  Denies change to bowel habitus.  Denies anorexia.  Denies vaginal pain, itch, discharge, spotting.  Patient is s/p TAH.  Patient has never been seen in this clinic before.  Has appointment scheduled for CPE with [facility].  Past Medical History  Diagnosis Date  . Heart palpitations   . Diabetes   . Stroke   . Hyperlipidemia   . Kidney stones   . TMJ (dislocation of temporomandibular joint)   . Asthma     Current Outpatient Prescriptions on File Prior to Visit  Medication Sig Dispense Refill  . Blood Glucose Monitoring Suppl (ACCU-CHEK COMPACT CARE KIT) KIT by Does not apply route as directed.      . clopidogrel (PLAVIX) 75 MG tablet Take 75 mg by mouth daily.      . clotrimazole-betamethasone (LOTRISONE) cream Apply 1 application topically 2 (two) times daily.      Marland Kitchen dicyclomine (BENTYL) 10 MG capsule Take 10 mg by mouth 4 (four) times daily -  before meals and at bedtime.      . FENOFIBRATE PO Take 135 mg by mouth daily.      . insulin glargine (LANTUS) 100 UNIT/ML  injection Inject 85 Units into the skin at bedtime.      . insulin lispro (HUMALOG) 100 UNIT/ML injection Inject into the skin. SLIDING SCALE      . levalbuterol (XOPENEX) 1.25 MG/3ML nebulizer solution Take 1.25 mg by nebulization every 4 (four) hours as needed for wheezing.      Marland Kitchen levocetirizine (XYZAL) 5 MG tablet Take 5 mg by mouth every evening.      . meclizine (ANTIVERT) 25 MG tablet Take 25 mg by mouth every 4 (four) hours as needed for dizziness.      . metoprolol succinate (TOPROL-XL) 50 MG 24 hr tablet Take 50 mg by mouth daily. Take with or immediately following a meal.      . pantoprazole (PROTONIX) 40 MG tablet Take 40 mg by mouth daily.      . Pitavastatin Calcium (LIVALO) 4 MG TABS Take 1 tablet by mouth at bedtime.      . pregabalin (LYRICA) 50 MG capsule Take 100 mg by mouth 3 (three) times daily.      . promethazine (PHENERGAN) 12.5 MG tablet Take 12.5 mg by mouth every 6 (six) hours as needed for nausea.      Marland Kitchen Respiratory Therapy Supplies (FLUTTER) DEVI Use as directed  1 each  0  . triamcinolone cream (KENALOG) 0.1 % Apply 1  application topically 2 (two) times daily.      . verapamil (VERELAN PM) 180 MG 24 hr capsule Take 180 mg by mouth at bedtime.       No current facility-administered medications on file prior to visit.    Allergies  Allergen Reactions  . Aspirin   . Atarax [Hydroxyzine]   . Baclofen   . Ciprofloxacin   . Iodine Tincture [Iodine]   . Isocal [Alitraq]   . Lopressor [Metoprolol Tartrate]   . Lyrica [Pregabalin]   . Metformin Hcl   . Methocarbamol   . Molds & Smuts   . Neurontin [Gabapentin]   . Nsaids   . Other     Grass and tress  . Penicillins   . Soma [Carisoprodol]     Family History  Problem Relation Age of Onset  . Emphysema Father     was a smoker  . Allergies      "everyone"  . Asthma Mother   . Heart disease Mother   . Heart disease Father   . Skin cancer Mother     History   Social History  . Marital Status: Single     Spouse Name: N/A    Number of Children: N/A  . Years of Education: N/A   Occupational History  . Retired     Social History Main Topics  . Smoking status: Never Smoker   . Smokeless tobacco: Never Used  . Alcohol Use: No  . Drug Use: No  . Sexual Activity: None   Other Topics Concern  . None   Social History Narrative  . None   ROS See HPI.  All other ROS negative.   Filed Vitals:   12/24/12 1327  BP: 128/78  Pulse: 100  Temp: 98.6 F (37 C)  Resp: 16   Physical Exam  Vitals reviewed. Constitutional: She is oriented to person, place, and time and well-developed, well-nourished, and in no distress.  HENT:  Head: Normocephalic and atraumatic.  Right Ear: External ear normal.  Left Ear: External ear normal.  Nose: Nose normal.  Mouth/Throat: Oropharynx is clear and moist. No oropharyngeal exudate.  Eyes: Conjunctivae are normal.  Neck: Neck supple. No thyromegaly present.  Cardiovascular: Regular rhythm, normal heart sounds and intact distal pulses.   Mildly tachycardic.  Pulmonary/Chest: Effort normal and breath sounds normal. No respiratory distress. She has no wheezes. She has no rales. She exhibits no tenderness.  Abdominal: Soft. Bowel sounds are normal. She exhibits no distension and no mass. There is no rebound and no guarding.  Positive suprapubic pain with palpation.  Musculoskeletal:  No CVA tenderness noted  Lymphadenopathy:    She has no cervical adenopathy.  Neurological: She is alert and oriented to person, place, and time.  Skin: Skin is warm and dry. No rash noted.   Assessment/Plan: Dysuria Urine dipstick positive for blood, glucose and protein.  Will send for micro and culture.  Rx Macrobid BID given allergies to other medication choices.  Patient reports normal CT 1 week ago.  Increase fluid intake.  Continue AZO.  If symptoms not improving, will need possible Urology referral.  Other malaise and fatigue Possibly due to combination of  urinary symptoms and chronic low back pain.  Will obtain CBC, CMP, TSH.

## 2012-12-24 NOTE — Assessment & Plan Note (Signed)
Urine dipstick positive for blood, glucose and protein.  Will send for micro and culture.  Rx Macrobid BID given allergies to other medication choices.  Patient reports normal CT 1 week ago.  Increase fluid intake.  Continue AZO.  If symptoms not improving, will need possible Urology referral.

## 2012-12-24 NOTE — Patient Instructions (Addendum)
Please obtain labs today.  I will call you with your results.  Take Macrobid twice daily for 5 days.  Please increase your fluid intake.  I will call you with the results of your culture and we will alter antibiotic if necessary.  Hopefully the fatigue is related to a UTI and will improve upon resolution of urinary symptoms.  If symptoms are not improving, we may need to set you up with a Urologist.

## 2012-12-25 LAB — URINALYSIS, ROUTINE W REFLEX MICROSCOPIC
Glucose, UA: >1000 mg/dL — AB
Leukocytes, UA: NEGATIVE
Nitrite: NEGATIVE
Specific Gravity, Urine: >1.03 — ABNORMAL HIGH (ref 1.005–1.030)
pH: 5.5 (ref 5.0–8.0)

## 2012-12-25 LAB — URINALYSIS, MICROSCOPIC ONLY

## 2012-12-25 LAB — TSH: TSH: 2.273 u[IU]/mL (ref 0.350–4.500)

## 2012-12-27 LAB — CULTURE, URINE COMPREHENSIVE

## 2013-01-29 ENCOUNTER — Ambulatory Visit (INDEPENDENT_AMBULATORY_CARE_PROVIDER_SITE_OTHER): Payer: Medicare Other | Admitting: Family Medicine

## 2013-01-29 ENCOUNTER — Encounter: Payer: Self-pay | Admitting: Family Medicine

## 2013-01-29 VITALS — BP 108/70 | HR 95 | Temp 97.9°F | Ht 68.0 in | Wt 213.0 lb

## 2013-01-29 DIAGNOSIS — E1059 Type 1 diabetes mellitus with other circulatory complications: Secondary | ICD-10-CM

## 2013-01-29 DIAGNOSIS — I10 Essential (primary) hypertension: Secondary | ICD-10-CM

## 2013-01-29 DIAGNOSIS — B379 Candidiasis, unspecified: Secondary | ICD-10-CM

## 2013-01-29 DIAGNOSIS — N390 Urinary tract infection, site not specified: Secondary | ICD-10-CM

## 2013-01-29 DIAGNOSIS — G43109 Migraine with aura, not intractable, without status migrainosus: Secondary | ICD-10-CM

## 2013-01-29 DIAGNOSIS — E1159 Type 2 diabetes mellitus with other circulatory complications: Secondary | ICD-10-CM

## 2013-01-29 DIAGNOSIS — E119 Type 2 diabetes mellitus without complications: Secondary | ICD-10-CM

## 2013-01-29 DIAGNOSIS — K5790 Diverticulosis of intestine, part unspecified, without perforation or abscess without bleeding: Secondary | ICD-10-CM

## 2013-01-29 DIAGNOSIS — K589 Irritable bowel syndrome without diarrhea: Secondary | ICD-10-CM

## 2013-01-29 DIAGNOSIS — F341 Dysthymic disorder: Secondary | ICD-10-CM

## 2013-01-29 DIAGNOSIS — G4700 Insomnia, unspecified: Secondary | ICD-10-CM

## 2013-01-29 DIAGNOSIS — T7840XA Allergy, unspecified, initial encounter: Secondary | ICD-10-CM

## 2013-01-29 DIAGNOSIS — E785 Hyperlipidemia, unspecified: Secondary | ICD-10-CM

## 2013-01-29 DIAGNOSIS — R5381 Other malaise: Secondary | ICD-10-CM

## 2013-01-29 DIAGNOSIS — G43909 Migraine, unspecified, not intractable, without status migrainosus: Secondary | ICD-10-CM

## 2013-01-29 DIAGNOSIS — H6691 Otitis media, unspecified, right ear: Secondary | ICD-10-CM

## 2013-01-29 DIAGNOSIS — E669 Obesity, unspecified: Secondary | ICD-10-CM

## 2013-01-29 DIAGNOSIS — F32A Depression, unspecified: Secondary | ICD-10-CM

## 2013-01-29 DIAGNOSIS — H6690 Otitis media, unspecified, unspecified ear: Secondary | ICD-10-CM

## 2013-01-29 DIAGNOSIS — F329 Major depressive disorder, single episode, unspecified: Secondary | ICD-10-CM

## 2013-01-29 DIAGNOSIS — K5730 Diverticulosis of large intestine without perforation or abscess without bleeding: Secondary | ICD-10-CM

## 2013-01-29 DIAGNOSIS — Z23 Encounter for immunization: Secondary | ICD-10-CM

## 2013-01-29 LAB — RENAL FUNCTION PANEL
Albumin: 4.1 g/dL (ref 3.5–5.2)
BUN: 17 mg/dL (ref 6–23)
CO2: 25 meq/L (ref 19–32)
Calcium: 9.9 mg/dL (ref 8.4–10.5)
Chloride: 97 meq/L (ref 96–112)
Creat: 0.61 mg/dL (ref 0.50–1.10)
Glucose, Bld: 404 mg/dL — ABNORMAL HIGH (ref 70–99)
Phosphorus: 4.3 mg/dL (ref 2.3–4.6)
Potassium: 4.5 meq/L (ref 3.5–5.3)
Sodium: 133 meq/L — ABNORMAL LOW (ref 135–145)

## 2013-01-29 LAB — HEPATIC FUNCTION PANEL
ALT: 11 U/L (ref 0–35)
AST: 13 U/L (ref 0–37)
Albumin: 4.1 g/dL (ref 3.5–5.2)
Alkaline Phosphatase: 114 U/L (ref 39–117)
Bilirubin, Direct: 0.1 mg/dL (ref 0.0–0.3)
Indirect Bilirubin: 0.4 mg/dL (ref 0.0–0.9)
Total Bilirubin: 0.5 mg/dL (ref 0.3–1.2)
Total Protein: 7.2 g/dL (ref 6.0–8.3)

## 2013-01-29 LAB — CBC
HCT: 46.8 % — ABNORMAL HIGH (ref 36.0–46.0)
MCV: 87.6 fL (ref 78.0–100.0)
Platelets: 315 10*3/uL (ref 150–400)
RBC: 5.34 MIL/uL — ABNORMAL HIGH (ref 3.87–5.11)
WBC: 7 10*3/uL (ref 4.0–10.5)

## 2013-01-29 LAB — LIPID PANEL
Cholesterol: 318 mg/dL — ABNORMAL HIGH (ref 0–200)
HDL: 44 mg/dL (ref >39)
Total CHOL/HDL Ratio: 7.2 Ratio
Triglycerides: 588 mg/dL — ABNORMAL HIGH (ref <150)

## 2013-01-29 LAB — TSH: TSH: 1.933 u[IU]/mL (ref 0.350–4.500)

## 2013-01-29 MED ORDER — MONTELUKAST SODIUM 10 MG PO TABS: 10.0000 mg | ORAL_TABLET | Freq: Every day | ORAL | Status: DC

## 2013-01-29 MED ORDER — DIAZEPAM 5 MG PO TABS: 5.0000 mg | ORAL_TABLET | Freq: Two times a day (BID) | ORAL | Status: DC | PRN

## 2013-01-29 MED ORDER — BUTALBITAL-APAP-CAFFEINE 50-325-40 MG PO TABS: 1.0000 | ORAL_TABLET | Freq: Four times a day (QID) | ORAL | Status: DC | PRN

## 2013-01-29 MED ORDER — FLUCONAZOLE 150 MG PO TABS: 150.0000 mg | ORAL_TABLET | ORAL | Status: AC

## 2013-01-29 MED ORDER — CETIRIZINE HCL 10 MG PO TABS: 10.0000 mg | ORAL_TABLET | Freq: Every day | ORAL | Status: DC

## 2013-01-29 MED ORDER — VENLAFAXINE HCL 50 MG PO TABS: 50.0000 mg | ORAL_TABLET | Freq: Two times a day (BID) | ORAL | Status: DC

## 2013-01-29 MED ORDER — DOXYCYCLINE HYCLATE 100 MG PO TABS: 100.0000 mg | ORAL_TABLET | Freq: Two times a day (BID) | ORAL | Status: DC

## 2013-01-29 NOTE — Patient Instructions (Signed)
Preventive Care for Adults, Female A healthy lifestyle and preventive care can promote health and wellness. Preventive health guidelines for women include the following key practices.  A routine yearly physical is a good way to check with your caregiver about your health and preventive screening. It is a chance to share any concerns and updates on your health, and to receive a thorough exam.  Visit your dentist for a routine exam and preventive care every 6 months. Brush your teeth twice a day and floss once a day. Good oral hygiene prevents tooth decay and gum disease.  The frequency of eye exams is based on your age, health, family medical history, use of contact lenses, and other factors. Follow your caregiver's recommendations for frequency of eye exams.  Eat a healthy diet. Foods like vegetables, fruits, whole grains, low-fat dairy products, and lean protein foods contain the nutrients you need without too many calories. Decrease your intake of foods high in solid fats, added sugars, and salt. Eat the right amount of calories for you.Get information about a proper diet from your caregiver, if necessary.  Regular physical exercise is one of the most important things you can do for your health. Most adults should get at least 150 minutes of moderate-intensity exercise (any activity that increases your heart rate and causes you to sweat) each week. In addition, most adults need muscle-strengthening exercises on 2 or more days a week.  Maintain a healthy weight. The body mass index (BMI) is a screening tool to identify possible weight problems. It provides an estimate of body fat based on height and weight. Your caregiver can help determine your BMI, and can help you achieve or maintain a healthy weight.For adults 20 years and older:  A BMI below 18.5 is considered underweight.  A BMI of 18.5 to 24.9 is normal.  A BMI of 25 to 29.9 is considered overweight.  A BMI of 30 and above is  considered obese.  Maintain normal blood lipids and cholesterol levels by exercising and minimizing your intake of saturated fat. Eat a balanced diet with plenty of fruit and vegetables. Blood tests for lipids and cholesterol should begin at age 20 and be repeated every 5 years. If your lipid or cholesterol levels are high, you are over 50, or you are at high risk for heart disease, you may need your cholesterol levels checked more frequently.Ongoing high lipid and cholesterol levels should be treated with medicines if diet and exercise are not effective.  If you smoke, find out from your caregiver how to quit. If you do not use tobacco, do not start.  If you are pregnant, do not drink alcohol. If you are breastfeeding, be very cautious about drinking alcohol. If you are not pregnant and choose to drink alcohol, do not exceed 1 drink per day. One drink is considered to be 12 ounces (355 mL) of beer, 5 ounces (148 mL) of wine, or 1.5 ounces (44 mL) of liquor.  Avoid use of street drugs. Do not share needles with anyone. Ask for help if you need support or instructions about stopping the use of drugs.  High blood pressure causes heart disease and increases the risk of stroke. Your blood pressure should be checked at least every 1 to 2 years. Ongoing high blood pressure should be treated with medicines if weight loss and exercise are not effective.  If you are 55 to 60 years old, ask your caregiver if you should take aspirin to prevent strokes.  Diabetes   screening involves taking a blood sample to check your fasting blood sugar level. This should be done once every 3 years, after age 45, if you are within normal weight and without risk factors for diabetes. Testing should be considered at a younger age or be carried out more frequently if you are overweight and have at least 1 risk factor for diabetes.  Breast cancer screening is essential preventive care for women. You should practice "breast  self-awareness." This means understanding the normal appearance and feel of your breasts and may include breast self-examination. Any changes detected, no matter how small, should be reported to a caregiver. Women in their 20s and 30s should have a clinical breast exam (CBE) by a caregiver as part of a regular health exam every 1 to 3 years. After age 40, women should have a CBE every year. Starting at age 40, women should consider having a mammography (breast X-ray test) every year. Women who have a family history of breast cancer should talk to their caregiver about genetic screening. Women at a high risk of breast cancer should talk to their caregivers about having magnetic resonance imaging (MRI) and a mammography every year.  The Pap test is a screening test for cervical cancer. A Pap test can show cell changes on the cervix that might become cervical cancer if left untreated. A Pap test is a procedure in which cells are obtained and examined from the lower end of the uterus (cervix).  Women should have a Pap test starting at age 21.  Between ages 21 and 29, Pap tests should be repeated every 2 years.  Beginning at age 30, you should have a Pap test every 3 years as long as the past 3 Pap tests have been normal.  Some women have medical problems that increase the chance of getting cervical cancer. Talk to your caregiver about these problems. It is especially important to talk to your caregiver if a new problem develops soon after your last Pap test. In these cases, your caregiver may recommend more frequent screening and Pap tests.  The above recommendations are the same for women who have or have not gotten the vaccine for human papillomavirus (HPV).  If you had a hysterectomy for a problem that was not cancer or a condition that could lead to cancer, then you no longer need Pap tests. Even if you no longer need a Pap test, a regular exam is a good idea to make sure no other problems are  starting.  If you are between ages 65 and 70, and you have had normal Pap tests going back 10 years, you no longer need Pap tests. Even if you no longer need a Pap test, a regular exam is a good idea to make sure no other problems are starting.  If you have had past treatment for cervical cancer or a condition that could lead to cancer, you need Pap tests and screening for cancer for at least 20 years after your treatment.  If Pap tests have been discontinued, risk factors (such as a new sexual partner) need to be reassessed to determine if screening should be resumed.  The HPV test is an additional test that may be used for cervical cancer screening. The HPV test looks for the virus that can cause the cell changes on the cervix. The cells collected during the Pap test can be tested for HPV. The HPV test could be used to screen women aged 30 years and older, and should   be used in women of any age who have unclear Pap test results. After the age of 30, women should have HPV testing at the same frequency as a Pap test.  Colorectal cancer can be detected and often prevented. Most routine colorectal cancer screening begins at the age of 50 and continues through age 75. However, your caregiver may recommend screening at an earlier age if you have risk factors for colon cancer. On a yearly basis, your caregiver may provide home test kits to check for hidden blood in the stool. Use of a small camera at the end of a tube, to directly examine the colon (sigmoidoscopy or colonoscopy), can detect the earliest forms of colorectal cancer. Talk to your caregiver about this at age 50, when routine screening begins. Direct examination of the colon should be repeated every 5 to 10 years through age 75, unless early forms of pre-cancerous polyps or small growths are found.  Hepatitis C blood testing is recommended for all people born from 1945 through 1965 and any individual with known risks for hepatitis C.  Practice  safe sex. Use condoms and avoid high-risk sexual practices to reduce the spread of sexually transmitted infections (STIs). STIs include gonorrhea, chlamydia, syphilis, trichomonas, herpes, HPV, and human immunodeficiency virus (HIV). Herpes, HIV, and HPV are viral illnesses that have no cure. They can result in disability, cancer, and death. Sexually active women aged 25 and younger should be checked for chlamydia. Older women with new or multiple partners should also be tested for chlamydia. Testing for other STIs is recommended if you are sexually active and at increased risk.  Osteoporosis is a disease in which the bones lose minerals and strength with aging. This can result in serious bone fractures. The risk of osteoporosis can be identified using a bone density scan. Women ages 65 and over and women at risk for fractures or osteoporosis should discuss screening with their caregivers. Ask your caregiver whether you should take a calcium supplement or vitamin D to reduce the rate of osteoporosis.  Menopause can be associated with physical symptoms and risks. Hormone replacement therapy is available to decrease symptoms and risks. You should talk to your caregiver about whether hormone replacement therapy is right for you.  Use sunscreen with sun protection factor (SPF) of 30 or more. Apply sunscreen liberally and repeatedly throughout the day. You should seek shade when your shadow is shorter than you. Protect yourself by wearing long sleeves, pants, a wide-brimmed hat, and sunglasses year round, whenever you are outdoors.  Once a month, do a whole body skin exam, using a mirror to look at the skin on your back. Notify your caregiver of new moles, moles that have irregular borders, moles that are larger than a pencil eraser, or moles that have changed in shape or color.  Stay current with required immunizations.  Influenza. You need a dose every fall (or winter). The composition of the flu vaccine  changes each year, so being vaccinated once is not enough.  Pneumococcal polysaccharide. You need 1 to 2 doses if you smoke cigarettes or if you have certain chronic medical conditions. You need 1 dose at age 65 (or older) if you have never been vaccinated.  Tetanus, diphtheria, pertussis (Tdap, Td). Get 1 dose of Tdap vaccine if you are younger than age 65, are over 65 and have contact with an infant, are a healthcare worker, are pregnant, or simply want to be protected from whooping cough. After that, you need a Td   booster dose every 10 years. Consult your caregiver if you have not had at least 3 tetanus and diphtheria-containing shots sometime in your life or have a deep or dirty wound.  HPV. You need this vaccine if you are a woman age 26 or younger. The vaccine is given in 3 doses over 6 months.  Measles, mumps, rubella (MMR). You need at least 1 dose of MMR if you were born in 1957 or later. You may also need a second dose.  Meningococcal. If you are age 19 to 21 and a first-year college student living in a residence hall, or have one of several medical conditions, you need to get vaccinated against meningococcal disease. You may also need additional booster doses.  Zoster (shingles). If you are age 60 or older, you should get this vaccine.  Varicella (chickenpox). If you have never had chickenpox or you were vaccinated but received only 1 dose, talk to your caregiver to find out if you need this vaccine.  Hepatitis A. You need this vaccine if you have a specific risk factor for hepatitis A virus infection or you simply wish to be protected from this disease. The vaccine is usually given as 2 doses, 6 to 18 months apart.  Hepatitis B. You need this vaccine if you have a specific risk factor for hepatitis B virus infection or you simply wish to be protected from this disease. The vaccine is given in 3 doses, usually over 6 months. Preventive Services / Frequency Ages 19 to 39  Blood  pressure check.** / Every 1 to 2 years.  Lipid and cholesterol check.** / Every 5 years beginning at age 20.  Clinical breast exam.** / Every 3 years for women in their 20s and 30s.  Pap test.** / Every 2 years from ages 21 through 29. Every 3 years starting at age 30 through age 65 or 70 with a history of 3 consecutive normal Pap tests.  HPV screening.** / Every 3 years from ages 30 through ages 65 to 70 with a history of 3 consecutive normal Pap tests.  Hepatitis C blood test.** / For any individual with known risks for hepatitis C.  Skin self-exam. / Monthly.  Influenza immunization.** / Every year.  Pneumococcal polysaccharide immunization.** / 1 to 2 doses if you smoke cigarettes or if you have certain chronic medical conditions.  Tetanus, diphtheria, pertussis (Tdap, Td) immunization. / A one-time dose of Tdap vaccine. After that, you need a Td booster dose every 10 years.  HPV immunization. / 3 doses over 6 months, if you are 26 and younger.  Measles, mumps, rubella (MMR) immunization. / You need at least 1 dose of MMR if you were born in 1957 or later. You may also need a second dose.  Meningococcal immunization. / 1 dose if you are age 19 to 21 and a first-year college student living in a residence hall, or have one of several medical conditions, you need to get vaccinated against meningococcal disease. You may also need additional booster doses.  Varicella immunization.** / Consult your caregiver.  Hepatitis A immunization.** / Consult your caregiver. 2 doses, 6 to 18 months apart.  Hepatitis B immunization.** / Consult your caregiver. 3 doses usually over 6 months. Ages 40 to 64  Blood pressure check.** / Every 1 to 2 years.  Lipid and cholesterol check.** / Every 5 years beginning at age 20.  Clinical breast exam.** / Every year after age 40.  Mammogram.** / Every year beginning at age 40   and continuing for as long as you are in good health. Consult with your  caregiver.  Pap test.** / Every 3 years starting at age 30 through age 65 or 70 with a history of 3 consecutive normal Pap tests.  HPV screening.** / Every 3 years from ages 30 through ages 65 to 70 with a history of 3 consecutive normal Pap tests.  Fecal occult blood test (FOBT) of stool. / Every year beginning at age 50 and continuing until age 75. You may not need to do this test if you get a colonoscopy every 10 years.  Flexible sigmoidoscopy or colonoscopy.** / Every 5 years for a flexible sigmoidoscopy or every 10 years for a colonoscopy beginning at age 50 and continuing until age 75.  Hepatitis C blood test.** / For all people born from 1945 through 1965 and any individual with known risks for hepatitis C.  Skin self-exam. / Monthly.  Influenza immunization.** / Every year.  Pneumococcal polysaccharide immunization.** / 1 to 2 doses if you smoke cigarettes or if you have certain chronic medical conditions.  Tetanus, diphtheria, pertussis (Tdap, Td) immunization.** / A one-time dose of Tdap vaccine. After that, you need a Td booster dose every 10 years.  Measles, mumps, rubella (MMR) immunization. / You need at least 1 dose of MMR if you were born in 1957 or later. You may also need a second dose.  Varicella immunization.** / Consult your caregiver.  Meningococcal immunization.** / Consult your caregiver.  Hepatitis A immunization.** / Consult your caregiver. 2 doses, 6 to 18 months apart.  Hepatitis B immunization.** / Consult your caregiver. 3 doses, usually over 6 months. Ages 65 and over  Blood pressure check.** / Every 1 to 2 years.  Lipid and cholesterol check.** / Every 5 years beginning at age 20.  Clinical breast exam.** / Every year after age 40.  Mammogram.** / Every year beginning at age 40 and continuing for as long as you are in good health. Consult with your caregiver.  Pap test.** / Every 3 years starting at age 30 through age 65 or 70 with a 3  consecutive normal Pap tests. Testing can be stopped between 65 and 70 with 3 consecutive normal Pap tests and no abnormal Pap or HPV tests in the past 10 years.  HPV screening.** / Every 3 years from ages 30 through ages 65 or 70 with a history of 3 consecutive normal Pap tests. Testing can be stopped between 65 and 70 with 3 consecutive normal Pap tests and no abnormal Pap or HPV tests in the past 10 years.  Fecal occult blood test (FOBT) of stool. / Every year beginning at age 50 and continuing until age 75. You may not need to do this test if you get a colonoscopy every 10 years.  Flexible sigmoidoscopy or colonoscopy.** / Every 5 years for a flexible sigmoidoscopy or every 10 years for a colonoscopy beginning at age 50 and continuing until age 75.  Hepatitis C blood test.** / For all people born from 1945 through 1965 and any individual with known risks for hepatitis C.  Osteoporosis screening.** / A one-time screening for women ages 65 and over and women at risk for fractures or osteoporosis.  Skin self-exam. / Monthly.  Influenza immunization.** / Every year.  Pneumococcal polysaccharide immunization.** / 1 dose at age 65 (or older) if you have never been vaccinated.  Tetanus, diphtheria, pertussis (Tdap, Td) immunization. / A one-time dose of Tdap vaccine if you are over   65 and have contact with an infant, are a healthcare worker, or simply want to be protected from whooping cough. After that, you need a Td booster dose every 10 years.  Varicella immunization.** / Consult your caregiver.  Meningococcal immunization.** / Consult your caregiver.  Hepatitis A immunization.** / Consult your caregiver. 2 doses, 6 to 18 months apart.  Hepatitis B immunization.** / Check with your caregiver. 3 doses, usually over 6 months. ** Family history and personal history of risk and conditions may change your caregiver's recommendations. Document Released: 05/10/2001 Document Revised: 06/06/2011  Document Reviewed: 08/09/2010 [employer] Patient Information 2014 [employer].  

## 2013-01-30 LAB — URINALYSIS
Bilirubin Urine: NEGATIVE
Glucose, UA: >1000 mg/dL — AB
Hgb urine dipstick: NEGATIVE
Ketones, ur: NEGATIVE mg/dL
Protein, ur: NEGATIVE mg/dL
Urobilinogen, UA: 0.2 mg/dL (ref 0.0–1.0)

## 2013-01-30 LAB — HEMOGLOBIN A1C: Mean Plasma Glucose: 338 mg/dL — ABNORMAL HIGH (ref <117)

## 2013-01-31 ENCOUNTER — Telehealth: Payer: Self-pay | Admitting: Family Medicine

## 2013-01-31 LAB — URINE CULTURE: Colony Count: 70000

## 2013-01-31 NOTE — Telephone Encounter (Signed)
Received medical records from [facility] ° °P: [phone] °F: [phone] °

## 2013-02-03 ENCOUNTER — Encounter: Payer: Self-pay | Admitting: Family Medicine

## 2013-02-03 DIAGNOSIS — K589 Irritable bowel syndrome without diarrhea: Secondary | ICD-10-CM

## 2013-02-03 DIAGNOSIS — G4700 Insomnia, unspecified: Secondary | ICD-10-CM

## 2013-02-03 DIAGNOSIS — K5790 Diverticulosis of intestine, part unspecified, without perforation or abscess without bleeding: Secondary | ICD-10-CM

## 2013-02-03 DIAGNOSIS — E669 Obesity, unspecified: Secondary | ICD-10-CM

## 2013-02-03 DIAGNOSIS — G43909 Migraine, unspecified, not intractable, without status migrainosus: Secondary | ICD-10-CM

## 2013-02-03 DIAGNOSIS — E785 Hyperlipidemia, unspecified: Secondary | ICD-10-CM

## 2013-02-03 DIAGNOSIS — E1051 Type 1 diabetes mellitus with diabetic peripheral angiopathy without gangrene: Secondary | ICD-10-CM | POA: Insufficient documentation

## 2013-02-03 DIAGNOSIS — E1059 Type 1 diabetes mellitus with other circulatory complications: Secondary | ICD-10-CM

## 2013-02-03 DIAGNOSIS — T7840XA Allergy, unspecified, initial encounter: Secondary | ICD-10-CM

## 2013-02-03 DIAGNOSIS — E1159 Type 2 diabetes mellitus with other circulatory complications: Secondary | ICD-10-CM

## 2013-02-03 HISTORY — DX: Hyperlipidemia, unspecified: E78.5

## 2013-02-03 HISTORY — DX: Type 1 diabetes mellitus with other circulatory complications: E10.59

## 2013-02-03 HISTORY — DX: Irritable bowel syndrome, unspecified: K58.9

## 2013-02-03 HISTORY — DX: Migraine, unspecified, not intractable, without status migrainosus: G43.909

## 2013-02-03 HISTORY — DX: Type 2 diabetes mellitus with other circulatory complications: E11.59

## 2013-02-03 HISTORY — DX: Diverticulosis of intestine, part unspecified, without perforation or abscess without bleeding: K57.90

## 2013-02-03 HISTORY — DX: Allergy, unspecified, initial encounter: T78.40XA

## 2013-02-03 HISTORY — DX: Obesity, unspecified: E66.9

## 2013-02-03 HISTORY — DX: Insomnia, unspecified: G47.00

## 2013-02-03 NOTE — Assessment & Plan Note (Signed)
Asymptomatic today, encouraged probiotics and fiber.

## 2013-02-03 NOTE — Assessment & Plan Note (Signed)
Encouraged DASH diet and increaed exercise as tolerated

## 2013-02-03 NOTE — Assessment & Plan Note (Signed)
Will request old records today, given flu and pneumonia shots today

## 2013-02-03 NOTE — Assessment & Plan Note (Signed)
Labs unremarkable, likely multifactorial

## 2013-02-03 NOTE — Assessment & Plan Note (Signed)
Needs bland diet with hi fiber, probiotics and adequate hydration.

## 2013-02-03 NOTE — Progress Notes (Signed)
Patient ID: Brittany Cannon, female   DOB: June 01, 1950, 62 y.o.   MRN: [id number] Brittany Cannon [id number] 1950-10-12 02/03/2013      Progress Note-Follow Up  Subjective  Chief Complaint  Chief Complaint  Patient presents with  . Establish Care    new patient  . Injections    prevnar and flu    HPI  Patient is a 62 year old caucasian female who is in today for new patient appt. Has a complicated PMH, she is pleasant but is difficult to interview as she skips around. She c/o dry, burning eyes. She notes insomnia, abdominal pain, nausea, no vomiting, has headaches occasionally, with migraines noting photophobia and phonophobia. Has persistent fatigue, congestion and intermittent SOB. Has not been following a diabetic diet. Was experiencing some vomiting but no longer.   Past Medical History  Diagnosis Date  . Heart palpitations   . Diabetes   . Stroke   . Hyperlipidemia   . Kidney stones   . TMJ (dislocation of temporomandibular joint)   . Asthma   . Hypertension   . Migraine syndrome 02/03/2013  . Allergic state 02/03/2013  . Obesity, unspecified 02/03/2013  . Insomnia 02/03/2013  . Diverticulosis 02/03/2013  . IBS (irritable bowel syndrome) 02/03/2013  . 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 Other and unspecified hyperlipidemia 02/03/2013  . Type II or unspecified type diabetes mellitus with peripheral circulatory disorders, uncontrolled(250.72) 02/03/2013    Past Surgical History  Procedure Laterality Date  . Rotator cuff repair  x 2   . Cholecystectomy    . Colon surgery  x 3   . Vesicovaginal fistula closure w/ tah    . Appendectomy    . Abdominal hysterectomy  1990    total    Family History  Problem Relation Age of Onset  . Emphysema Father     was a smoker  . Heart disease Father   . Heart attack Father   . Asthma Mother   . Heart disease Mother     catherization   . Cancer Mother     skin   . Hyperlipidemia Mother   . Hypertension Mother   . Heart attack Mother   . Thyroid disease Sister   . Heart disease Brother   . Kidney Stones Brother   . Cancer Sister     breast- milk duct  . Asthma Sister   . Heart disease Brother   . Kidney Stones Brother   . Heart disease Brother   . Heart murmur Brother   . Heart murmur Brother   . Kidney Stones Brother     History   Social History  . Marital Status: Single    Spouse Name: N/A    Number of Children: N/A  . Years of Education: N/A   Occupational History  . Retired     Social History Main Topics  . Smoking status: Never Smoker   . Smokeless tobacco: Never Used  . Alcohol Use: No  . Drug Use: No  . Sexual Activity: No   Other Topics Concern  . Not on file   Social History Narrative  . No narrative on file    Current Outpatient Prescriptions on File Prior to Visit  Medication Sig Dispense Refill  . Blood Glucose Monitoring Suppl (ACCU-CHEK COMPACT CARE KIT) KIT by Does not apply route as directed.      . clopidogrel (PLAVIX) 75 MG tablet Take 75  mg by mouth daily.      . clotrimazole-betamethasone (LOTRISONE) cream Apply 1 application topically 2 (two) times daily.      Marland Kitchen dicyclomine (BENTYL) 10 MG capsule Take 10 mg by mouth 4 (four) times daily -  before meals and at bedtime.      . FENOFIBRATE PO Take 135 mg by mouth daily.      . insulin glargine (LANTUS) 100 UNIT/ML injection Inject 85 Units into the skin at bedtime.      . insulin lispro (HUMALOG) 100 UNIT/ML injection Inject into the skin. SLIDING SCALE      . levalbuterol (XOPENEX) 1.25 MG/3ML nebulizer solution Take 1.25 mg by nebulization every 4 (four) hours as needed for wheezing.      Marland Kitchen levocetirizine (XYZAL) 5 MG tablet Take 5 mg by mouth every evening.      . meclizine (ANTIVERT) 25 MG tablet Take 25 mg by mouth every 4 (four) hours as needed for dizziness.      . metoprolol succinate (TOPROL-XL) 50 MG 24 hr tablet  Take 50 mg by mouth daily. Take with or immediately following a meal.      . nitrofurantoin, macrocrystal-monohydrate, (MACROBID) 100 MG capsule Take 1 capsule (100 mg total) by mouth 2 (two) times daily.  10 capsule  0  . pantoprazole (PROTONIX) 40 MG tablet Take 40 mg by mouth daily.      . Pitavastatin Calcium (LIVALO) 4 MG TABS Take 1 tablet by mouth at bedtime.      . pregabalin (LYRICA) 50 MG capsule Take 100 mg by mouth 3 (three) times daily.      . promethazine (PHENERGAN) 12.5 MG tablet Take 12.5 mg by mouth every 6 (six) hours as needed for nausea.      Marland Kitchen Respiratory Therapy Supplies (FLUTTER) DEVI Use as directed  1 each  0  . triamcinolone cream (KENALOG) 0.1 % Apply 1 application topically 2 (two) times daily.      . verapamil (VERELAN PM) 180 MG 24 hr capsule Take 180 mg by mouth at bedtime.       No current facility-administered medications on file prior to visit.    Allergies  Allergen Reactions  . Aspirin   . Atarax [Hydroxyzine]   . Baclofen   . Ciprofloxacin   . Iodine Tincture [Iodine]   . Isocal [Alitraq]   . Lyrica [Pregabalin]   . Metformin Hcl   . Methocarbamol   . Molds & Smuts   . Neurontin [Gabapentin]   . Nsaids   . Other     Grass and tress  . Penicillins   . Soma [Carisoprodol]     Review of Systems  Review of Systems  Constitutional: Positive for malaise/fatigue. Negative for fever and chills.  HENT: Positive for congestion. Negative for hearing loss and nosebleeds.   Eyes: Positive for pain. Negative for discharge.       C/o dry eyes  Respiratory: Negative for cough, sputum production, shortness of breath and wheezing.   Cardiovascular: Negative for chest pain, palpitations and leg swelling.  Gastrointestinal: Positive for abdominal pain. Negative for heartburn, nausea, vomiting, diarrhea, constipation and blood in stool.  Genitourinary: Negative for dysuria, urgency, frequency and hematuria.  Musculoskeletal: Positive for myalgias. Negative  for back pain and falls.  Skin: Negative for rash.  Neurological: Negative for dizziness, tremors, sensory change, focal weakness, loss of consciousness, weakness and headaches.  Endo/Heme/Allergies: Positive for polydipsia. Does not bruise/bleed easily.  Psychiatric/Behavioral: Negative for depression and suicidal  ideas. The patient is not nervous/anxious and does not have insomnia.     Objective  BP 108/70  Pulse 95  Temp(Src) 97.9 F (36.6 C) (Oral)  Ht 5\' 8"  (1.727 m)  Wt 213 lb (96.616 kg)  BMI 32.39 kg/m2  SpO2 97%  Physical Exam  Physical Exam  Constitutional: She is oriented to person, place, and time and well-developed, well-nourished, and in no distress. No distress.  HENT:  Head: Normocephalic and atraumatic.  Eyes: Conjunctivae are normal.  Neck: Neck supple. No thyromegaly present.  Cardiovascular: Normal rate, regular rhythm and normal heart sounds.   No murmur heard. Pulmonary/Chest: Effort normal and breath sounds normal. She has no wheezes.  Abdominal: She exhibits no distension and no mass.  Musculoskeletal: She exhibits no edema.  Lymphadenopathy:    She has no cervical adenopathy.  Neurological: She is alert and oriented to person, place, and time.  Skin: Skin is warm and dry. No rash noted. She is not diaphoretic.  Psychiatric: Memory, affect and judgment normal.    Lab Results  Component Value Date   TSH 1.933 01/29/2013   Lab Results  Component Value Date   WBC 7.0 01/29/2013   HGB 15.5* 01/29/2013   HCT 46.8* 01/29/2013   MCV 87.6 01/29/2013   PLT 315 01/29/2013   Lab Results  Component Value Date   CREATININE 0.61 01/29/2013   BUN 17 01/29/2013   NA 133* 01/29/2013   K 4.5 01/29/2013   CL 97 01/29/2013   CO2 25 01/29/2013   Lab Results  Component Value Date   ALT 11 01/29/2013   AST 13 01/29/2013   ALKPHOS 114 01/29/2013   BILITOT 0.5 01/29/2013   Lab Results  Component Value Date   CHOL 318* 01/29/2013   Lab Results  Component Value Date    HDL 44 01/29/2013   Lab Results  Component Value Date   LDLCALC Comment:   Not calculated due to Triglyceride >400. Suggest ordering Direct LDL (Unit Code: 11914).   Total Cholesterol/HDL Ratio:CHD Risk                        Coronary Heart Disease Risk Table                                        Men       Women          1/2 Average Risk              3.4        3.3              Average Risk              5.0        4.4           2X Average Risk              9.6        7.1           3X Average Risk             23.4       11.0 Use the calculated Patient Ratio above and the CHD Risk table  to determine the patient's CHD Risk. ATP III Classification (LDL):       < 100  mg/dL         Optimal      161 - 129     mg/dL         Near or Above Optimal      130 - 159     mg/dL         Borderline High      160 - 189     mg/dL         High       > 096        mg/dL         Very High   06/30/4096   Lab Results  Component Value Date   TRIG 588* 01/29/2013   Lab Results  Component Value Date   CHOLHDL 7.2 01/29/2013     Assessment & Plan  Hypertension Well controlled today  Type II or unspecified type diabetes mellitus with peripheral circulatory disorders, uncontrolled(250.72) Is on insulin but is very poorly controlled, has seen endocrinology in past, is encouraged to return there for further consideration. Will adjust meds if she decides not to return to endocrinology  Diverticulosis Asymptomatic today, encouraged probiotics and fiber.  Obesity, unspecified Encouraged DASH diet and increaed exercise as tolerated  IBS (irritable bowel syndrome) Needs bland diet with hi fiber, probiotics and adequate hydration.   Other malaise and fatigue Labs unremarkable, likely multifactorial  Insomnia Will request old records today, given flu and pneumonia shots today

## 2013-02-03 NOTE — Assessment & Plan Note (Signed)
Is on insulin but is very poorly controlled, has seen endocrinology in past, is encouraged to return there for further consideration. Will adjust meds if she decides not to return to endocrinology

## 2013-02-03 NOTE — Assessment & Plan Note (Signed)
Well controlled today.

## 2013-02-04 ENCOUNTER — Other Ambulatory Visit: Payer: Self-pay | Admitting: Family Medicine

## 2013-02-04 ENCOUNTER — Telehealth: Payer: Self-pay

## 2013-02-04 DIAGNOSIS — E785 Hyperlipidemia, unspecified: Secondary | ICD-10-CM

## 2013-02-04 DIAGNOSIS — E1059 Type 1 diabetes mellitus with other circulatory complications: Secondary | ICD-10-CM

## 2013-02-04 MED ORDER — ATORVASTATIN CALCIUM 10 MG PO TABS: 10.0000 mg | ORAL_TABLET | Freq: Every day | ORAL | Status: DC

## 2013-02-04 NOTE — Telephone Encounter (Signed)
Patient was called and informed of results. Patient agreed to endocrinology referral and to the start of the new medication.

## 2013-02-04 NOTE — Telephone Encounter (Signed)
Message copied by Eulis Manly on Mon Feb 04, 2013  2:18 PM ------      Message from: Danise Edge A      Created: Wed Jan 30, 2013 11:50 AM       Notify patient her sugar and cholesterol are both very high I would like to start her on a new statin and refer her to endocrinology for further consideration due to her numbers ------

## 2013-02-08 ENCOUNTER — Telehealth: Payer: Self-pay

## 2013-02-08 NOTE — Telephone Encounter (Signed)
Message copied by Court Joy on Fri Feb 08, 2013 10:49 PM ------      Message from: Danise Edge A      Created: Sun Feb 03, 2013 11:57 AM       Her hgba1c was very high, has seen Dr Lucianne Muss in past with endocrinology I believe. See if she is willing to see him again. Otherwise she needs to come back in here in next month to discuss heroptions. ------

## 2013-02-08 NOTE — Telephone Encounter (Signed)
Call and inform pt.

## 2013-02-11 NOTE — Telephone Encounter (Signed)
Left a message for pt to return my call.  Also need to make sure pt did get her pneumovax injection at her last appt or just the flu vaccination?

## 2013-02-13 ENCOUNTER — Telehealth (INDEPENDENT_AMBULATORY_CARE_PROVIDER_SITE_OTHER): Payer: Medicare Other

## 2013-02-13 ENCOUNTER — Other Ambulatory Visit: Payer: Self-pay | Admitting: Family Medicine

## 2013-02-13 DIAGNOSIS — E119 Type 2 diabetes mellitus without complications: Secondary | ICD-10-CM

## 2013-02-13 DIAGNOSIS — Z23 Encounter for immunization: Secondary | ICD-10-CM

## 2013-02-13 NOTE — Telephone Encounter (Signed)
prevnar needed to be documented

## 2013-02-13 NOTE — Telephone Encounter (Signed)
Pt states she did have the Pneumovax injection. I will add this.  Pt would like a referral back to Dr Lucianne Muss (hasn't been there in well over 2 yrs)   Pt was also made aware of her labs from 01-29-13. (not sure if they got closed on accident?)   Pt states that penicillin makes her throat close.   Please advise which antibiotic needs to be sent?  Pt was informed to check with pharmacy throughout the day for the antibiotic and that someone would call her in the next few days for the referral

## 2013-02-16 ENCOUNTER — Encounter (HOSPITAL_BASED_OUTPATIENT_CLINIC_OR_DEPARTMENT_OTHER): Payer: Self-pay | Admitting: Emergency Medicine

## 2013-02-16 ENCOUNTER — Emergency Department (HOSPITAL_BASED_OUTPATIENT_CLINIC_OR_DEPARTMENT_OTHER)
Admission: EM | Admit: 2013-02-16 | Discharge: 2013-02-16 | Disposition: A | Discharge status: Home / Self Care | Payer: Medicare Other | Attending: Emergency Medicine | Admitting: Emergency Medicine

## 2013-02-16 DIAGNOSIS — Z79899 Other long term (current) drug therapy: Secondary | ICD-10-CM | POA: Insufficient documentation

## 2013-02-16 DIAGNOSIS — E669 Obesity, unspecified: Secondary | ICD-10-CM | POA: Insufficient documentation

## 2013-02-16 DIAGNOSIS — Z87442 Personal history of urinary calculi: Secondary | ICD-10-CM | POA: Insufficient documentation

## 2013-02-16 DIAGNOSIS — E1059 Type 1 diabetes mellitus with other circulatory complications: Secondary | ICD-10-CM | POA: Insufficient documentation

## 2013-02-16 DIAGNOSIS — IMO0002 Reserved for concepts with insufficient information to code with codable children: Secondary | ICD-10-CM | POA: Insufficient documentation

## 2013-02-16 DIAGNOSIS — E785 Hyperlipidemia, unspecified: Secondary | ICD-10-CM | POA: Insufficient documentation

## 2013-02-16 DIAGNOSIS — Z880 Allergy status to penicillin: Secondary | ICD-10-CM | POA: Insufficient documentation

## 2013-02-16 DIAGNOSIS — I798 Other disorders of arteries, arterioles and capillaries in diseases classified elsewhere: Secondary | ICD-10-CM | POA: Insufficient documentation

## 2013-02-16 DIAGNOSIS — Z8719 Personal history of other diseases of the digestive system: Secondary | ICD-10-CM | POA: Insufficient documentation

## 2013-02-16 DIAGNOSIS — I10 Essential (primary) hypertension: Secondary | ICD-10-CM | POA: Insufficient documentation

## 2013-02-16 DIAGNOSIS — Z7902 Long term (current) use of antithrombotics/antiplatelets: Secondary | ICD-10-CM | POA: Insufficient documentation

## 2013-02-16 DIAGNOSIS — G43909 Migraine, unspecified, not intractable, without status migrainosus: Secondary | ICD-10-CM | POA: Insufficient documentation

## 2013-02-16 DIAGNOSIS — R0982 Postnasal drip: Secondary | ICD-10-CM | POA: Insufficient documentation

## 2013-02-16 DIAGNOSIS — R079 Chest pain, unspecified: Secondary | ICD-10-CM | POA: Insufficient documentation

## 2013-02-16 DIAGNOSIS — J329 Chronic sinusitis, unspecified: Secondary | ICD-10-CM | POA: Insufficient documentation

## 2013-02-16 DIAGNOSIS — Z87828 Personal history of other (healed) physical injury and trauma: Secondary | ICD-10-CM | POA: Insufficient documentation

## 2013-02-16 DIAGNOSIS — Z8673 Personal history of transient ischemic attack (TIA), and cerebral infarction without residual deficits: Secondary | ICD-10-CM | POA: Insufficient documentation

## 2013-02-16 DIAGNOSIS — Z794 Long term (current) use of insulin: Secondary | ICD-10-CM | POA: Insufficient documentation

## 2013-02-16 DIAGNOSIS — J45909 Unspecified asthma, uncomplicated: Secondary | ICD-10-CM | POA: Insufficient documentation

## 2013-02-16 DIAGNOSIS — Z792 Long term (current) use of antibiotics: Secondary | ICD-10-CM | POA: Insufficient documentation

## 2013-02-16 DIAGNOSIS — G4700 Insomnia, unspecified: Secondary | ICD-10-CM | POA: Insufficient documentation

## 2013-02-16 MED ORDER — DOXYCYCLINE HYCLATE 100 MG PO CAPS: 100.0000 mg | ORAL_CAPSULE | Freq: Two times a day (BID) | ORAL | Status: DC

## 2013-02-16 MED ORDER — DEXAMETHASONE SODIUM PHOSPHATE 10 MG/ML IJ SOLN
10.0000 mg | Freq: Once | INTRAMUSCULAR | Status: AC
  Administered 2013-02-16: 10 mg via INTRAMUSCULAR
  Filled 2013-02-16: qty 1

## 2013-02-16 NOTE — ED Provider Notes (Signed)
CSN: [id number]     Arrival date & time 02/16/13  1110 History   First MD Initiated Contact with Patient 02/16/13 1142     Chief Complaint  Patient presents with  . Facial Pain   (Consider location/radiation/quality/duration/timing/severity/associated sxs/prior Treatment) HPI Comments: Patient presents with sinus pain. She states she's had 2 week worsening history of pain and pressure in her sinuses. She's had some nasal discharge. She's also getting associated sinus headache. She denies he fevers or chills. She denies any cough or chest congestion. She has a history of pleurisy and does have some pain in her right chest associated with pleurisy. She states is unchanged from her Thursday in the past. She denies any shortness of breath. She denied the leg swelling or calf tenderness. She's been using over-the-counter medicines without relief.   Past Medical History  Diagnosis Date  . Heart palpitations   . Diabetes   . Stroke   . Hyperlipidemia   . Kidney stones   . TMJ (dislocation of temporomandibular joint)   . Asthma   . Hypertension   . Migraine syndrome 02/03/2013  . Allergic state 02/03/2013  . Obesity, unspecified 02/03/2013  . Insomnia 02/03/2013  . Diverticulosis 02/03/2013  . IBS (irritable bowel syndrome) 02/03/2013  . 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 Other and unspecified hyperlipidemia 02/03/2013  . Type II or unspecified type diabetes mellitus with peripheral circulatory disorders, uncontrolled(250.72) 02/03/2013   Past Surgical History  Procedure Laterality Date  . Rotator cuff repair  x 2   . Cholecystectomy    . Colon surgery  x 3   . Vesicovaginal fistula closure w/ tah    . Appendectomy    . Abdominal hysterectomy  1990    total   Family History  Problem Relation Age of Onset  . Emphysema Father     was a smoker  . Heart disease Father   . Heart attack Father   .  Asthma Mother   . Heart disease Mother     catherization  . Cancer Mother     skin   . Hyperlipidemia Mother   . Hypertension Mother   . Heart attack Mother   . Thyroid disease Sister   . Heart disease Brother   . Kidney Stones Brother   . Cancer Sister     breast- milk duct  . Asthma Sister   . Heart disease Brother   . Kidney Stones Brother   . Heart disease Brother   . Heart murmur Brother   . Heart murmur Brother   . Kidney Stones Brother    History  Substance Use Topics  . Smoking status: Never Smoker   . Smokeless tobacco: Never Used  . Alcohol Use: No   OB History   Grav Para Term Preterm Abortions TAB SAB Ect Mult Living                 Review of Systems  Constitutional: Negative for fever, chills, diaphoresis and fatigue.  HENT: Positive for postnasal drip and sinus pressure. Negative for congestion, rhinorrhea and sneezing.   Eyes: Negative.   Respiratory: Negative for cough, chest tightness and shortness of breath.   Cardiovascular: Positive for chest pain. Negative for leg swelling.  Gastrointestinal: Negative for nausea, vomiting, abdominal pain, diarrhea and blood in stool.  Genitourinary: Negative for frequency, hematuria, flank pain and difficulty urinating.  Musculoskeletal: Negative for arthralgias and back pain.  Skin: Negative  for rash.  Neurological: Negative for dizziness, speech difficulty, weakness, numbness and headaches.    Allergies  Aspirin; Atarax; Baclofen; Ciprofloxacin; Iodine tincture; Isocal; Lyrica; Metformin hcl; Methocarbamol; Molds & smuts; Neurontin; Nsaids; Other; Penicillins; and Soma  Home Medications   Current Outpatient Rx  Name  Route  Sig  Dispense  Refill  . atorvastatin (LIPITOR) 10 MG tablet   Oral   Take 1 tablet (10 mg total) by mouth daily.   30 tablet   3   . Blood Glucose Monitoring Suppl (ACCU-CHEK COMPACT CARE KIT) KIT   Does not apply   by Does not apply route as directed.         .  butalbital-acetaminophen-caffeine (FIORICET) 50-325-40 MG per tablet   Oral   Take 1-2 tablets by mouth every 6 (six) hours as needed for headache.   20 tablet   0   . cetirizine (ZYRTEC) 10 MG tablet   Oral   Take 1 tablet (10 mg total) by mouth daily.   30 tablet   11   . clopidogrel (PLAVIX) 75 MG tablet   Oral   Take 75 mg by mouth daily.         . clotrimazole-betamethasone (LOTRISONE) cream   Topical   Apply 1 application topically 2 (two) times daily.         . diazepam (VALIUM) 5 MG tablet   Oral   Take 1 tablet (5 mg total) by mouth every 12 (twelve) hours as needed for anxiety.   30 tablet   1   . dicyclomine (BENTYL) 10 MG capsule   Oral   Take 10 mg by mouth 4 (four) times daily -  before meals and at bedtime.         Marland Kitchen doxycycline (VIBRA-TABS) 100 MG tablet   Oral   Take 1 tablet (100 mg total) by mouth 2 (two) times daily.   20 tablet   0   . doxycycline (VIBRAMYCIN) 100 MG capsule   Oral   Take 1 capsule (100 mg total) by mouth 2 (two) times daily. One po bid x 7 days   14 capsule   0   . FENOFIBRATE PO   Oral   Take 135 mg by mouth daily.         . fluconazole (DIFLUCAN) 150 MG tablet   Oral   Take 1 tablet (150 mg total) by mouth once a week.   2 tablet   1   . insulin glargine (LANTUS) 100 UNIT/ML injection   Subcutaneous   Inject 85 Units into the skin at bedtime.         . insulin lispro (HUMALOG) 100 UNIT/ML injection   Subcutaneous   Inject into the skin. SLIDING SCALE         . levalbuterol (XOPENEX) 1.25 MG/3ML nebulizer solution   Nebulization   Take 1.25 mg by nebulization every 4 (four) hours as needed for wheezing.         Marland Kitchen levocetirizine (XYZAL) 5 MG tablet   Oral   Take 5 mg by mouth every evening.         . meclizine (ANTIVERT) 25 MG tablet   Oral   Take 25 mg by mouth every 4 (four) hours as needed for dizziness.         . metoprolol succinate (TOPROL-XL) 50 MG 24 hr tablet   Oral   Take 50 mg  by mouth daily. Take with or immediately following a meal.         .  montelukast (SINGULAIR) 10 MG tablet   Oral   Take 1 tablet (10 mg total) by mouth at bedtime.   30 tablet   3   . nitrofurantoin, macrocrystal-monohydrate, (MACROBID) 100 MG capsule   Oral   Take 1 capsule (100 mg total) by mouth 2 (two) times daily.   10 capsule   0   . pantoprazole (PROTONIX) 40 MG tablet   Oral   Take 40 mg by mouth daily.         . Pitavastatin Calcium (LIVALO) 4 MG TABS   Oral   Take 1 tablet by mouth at bedtime.         . pregabalin (LYRICA) 50 MG capsule   Oral   Take 100 mg by mouth 3 (three) times daily.         . promethazine (PHENERGAN) 12.5 MG tablet   Oral   Take 12.5 mg by mouth every 6 (six) hours as needed for nausea.         Marland Kitchen Respiratory Therapy Supplies (FLUTTER) DEVI      Use as directed   1 each   0   . triamcinolone cream (KENALOG) 0.1 %   Topical   Apply 1 application topically 2 (two) times daily.         Marland Kitchen venlafaxine (EFFEXOR) 50 MG tablet   Oral   Take 1 tablet (50 mg total) by mouth 2 (two) times daily with a meal.   60 tablet   1   . verapamil (VERELAN PM) 180 MG 24 hr capsule   Oral   Take 180 mg by mouth at bedtime.          BP 124/76  Pulse 98  Temp(Src) 97.5 F (36.4 C) (Oral)  Ht 5\' 8"  (1.727 m)  Wt 224 lb (101.606 kg)  BMI 34.07 kg/m2  SpO2 99% Physical Exam  Constitutional: She is oriented to person, place, and time. She appears well-developed and well-nourished.  HENT:  Head: Normocephalic and atraumatic.  Right Ear: External ear normal.  Left Ear: External ear normal.  Mouth/Throat: Oropharynx is clear and moist.  Tenderness over the frontal and maxillary sinuses  Eyes: Pupils are equal, round, and reactive to light.  Neck: Normal range of motion. Neck supple.  Cardiovascular: Normal rate, regular rhythm and normal heart sounds.   Pulmonary/Chest: Effort normal and breath sounds normal. No respiratory distress.  She has no wheezes. She has no rales. She exhibits no tenderness.  Abdominal: Soft. Bowel sounds are normal. There is no tenderness. There is no rebound and no guarding.  Musculoskeletal: Normal range of motion. She exhibits no edema.  Lymphadenopathy:    She has no cervical adenopathy.  Neurological: She is alert and oriented to person, place, and time.  Skin: Skin is warm and dry. No rash noted.  Psychiatric: She has a normal mood and affect.    ED Course  Procedures (including critical care time) Labs Review Labs Reviewed - No data to display Imaging Review No results found.  EKG Interpretation   None       MDM   1. Sinusitis    Given the patient's symptoms have been going on for 2 weeks I will go ahead and treat her with antibiotics as well as a shot of steroids. I advised her she can continue the over-the-counter medicines that she's been taking. She denies any symptoms that would be suggestive of pneumonia. She has some sharp pain on breathing in her right chest which  is consistent with her past pleurisy. She has no new symptoms or shortness of breath that would make me more concerned about a pulmonary embolus. She has normal oxygen saturations. She has no tachypnea on my exam.    Rolan Bucco, MD 02/16/13 1206

## 2013-02-16 NOTE — ED Notes (Signed)
Patient states she has had a sinus infection for two weeks, denies fever.

## 2013-02-20 ENCOUNTER — Telehealth: Payer: Self-pay | Admitting: Family Medicine

## 2013-02-20 NOTE — Telephone Encounter (Signed)
Request to speak to ofc manager, states she was told she could call our office on a Saturday if she needed an acute visit. Pt states she called and was given a number, does not recall the phone number. Pt states she called the number over and over without answer. Pt ended up going to er, wants to be reimbursed.

## 2013-02-20 NOTE — Telephone Encounter (Signed)
Spoke with patient RE: Endocrinology appt scheduled, pt had two appts 1) 12.01.14 w/Dr Lucianne Muss at 9:00a & 2) 12.09.14 w/Dr. Reino Kent at 1:15p; pt would like to keep the 12.09.14 appt w/Dr. Reino Kent, informed [facility] who will cancel 12.01.14 appt/SLS

## 2013-02-20 NOTE — Telephone Encounter (Signed)
Returning call.

## 2013-02-22 NOTE — Telephone Encounter (Signed)
Left message for patient to call me back. 

## 2013-02-22 NOTE — Telephone Encounter (Signed)
Spoke with patient told her to call back once she receives a bill from her ER visit and per Truddie Hidden we will see what we can do for her. Patient voiced understanding

## 2013-02-25 ENCOUNTER — Ambulatory Visit: Payer: Medicare Other | Admitting: Endocrinology

## 2013-02-26 ENCOUNTER — Ambulatory Visit: Payer: Medicare Other | Admitting: Family Medicine

## 2013-02-28 ENCOUNTER — Ambulatory Visit (HOSPITAL_BASED_OUTPATIENT_CLINIC_OR_DEPARTMENT_OTHER)
Admission: RE | Admit: 2013-02-28 | Discharge: 2013-02-28 | Disposition: A | Discharge status: Home / Self Care | Payer: Medicare Other | Source: Ambulatory Visit | Attending: Physician Assistant | Admitting: Physician Assistant

## 2013-02-28 ENCOUNTER — Encounter: Payer: Self-pay | Admitting: Physician Assistant

## 2013-02-28 ENCOUNTER — Telehealth: Payer: Self-pay | Admitting: Family Medicine

## 2013-02-28 ENCOUNTER — Ambulatory Visit (INDEPENDENT_AMBULATORY_CARE_PROVIDER_SITE_OTHER): Payer: Medicare Other | Admitting: Physician Assistant

## 2013-02-28 VITALS — BP 110/72 | HR 104 | Temp 98.1°F | Resp 16 | Ht 68.0 in | Wt 210.5 lb

## 2013-02-28 DIAGNOSIS — R05 Cough: Secondary | ICD-10-CM | POA: Insufficient documentation

## 2013-02-28 DIAGNOSIS — R059 Cough, unspecified: Secondary | ICD-10-CM | POA: Insufficient documentation

## 2013-02-28 DIAGNOSIS — G43909 Migraine, unspecified, not intractable, without status migrainosus: Secondary | ICD-10-CM

## 2013-02-28 DIAGNOSIS — R0781 Pleurodynia: Secondary | ICD-10-CM

## 2013-02-28 DIAGNOSIS — R079 Chest pain, unspecified: Secondary | ICD-10-CM | POA: Insufficient documentation

## 2013-02-28 DIAGNOSIS — R071 Chest pain on breathing: Secondary | ICD-10-CM

## 2013-02-28 DIAGNOSIS — J329 Chronic sinusitis, unspecified: Secondary | ICD-10-CM

## 2013-02-28 MED ORDER — ELETRIPTAN HYDROBROMIDE 20 MG PO TABS: 20.0000 mg | ORAL_TABLET | Freq: Once | ORAL | Status: DC

## 2013-02-28 MED ORDER — RIZATRIPTAN BENZOATE 5 MG PO TBDP: 5.0000 mg | ORAL_TABLET | ORAL | Status: DC | PRN

## 2013-02-28 NOTE — Progress Notes (Signed)
Patient ID: Brittany Cannon, female   DOB: 06-04-1950, 62 y.o.   MRN: [id number]  Patient presents to clinic today c/o sinus pressure, pain, nasal congestion, post-nasal drip x 1 month.  Patient has been through two rounds of doxycycline given by other providers.  Has been given a steroid injection x 1 in the ER for symptoms.  Patient endorses history of asthma and allergies for which she takes multiple medications.  Patient denies overt shortness of breath or wheezing, but has notes some right-sided pleuritic chest pain over the past week.  O2 sats at 96% in clinic.  Respirations WNL. Pulse slightly tachycardic at 104.    Patient also complains of migraine headache associated with aura, nausea and vomiting that has been intermittent over the past couple of days.  Has Rx for fiorinal which she has taken with no relief of symptoms.  Patient endorses sonophobia.    Past Medical History  Diagnosis Date  . Heart palpitations   . Diabetes   . Stroke   . Hyperlipidemia   . Kidney stones   . TMJ (dislocation of temporomandibular joint)   . Asthma   . Hypertension   . Migraine syndrome 02/03/2013  . Allergic state 02/03/2013  . Obesity, unspecified 02/03/2013  . Insomnia 02/03/2013  . Diverticulosis 02/03/2013  . IBS (irritable bowel syndrome) 02/03/2013  . 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 Other and unspecified hyperlipidemia 02/03/2013  . Type II or unspecified type diabetes mellitus with peripheral circulatory disorders, uncontrolled(250.72) 02/03/2013    Current Outpatient Prescriptions on File Prior to Visit  Medication Sig Dispense Refill  . atorvastatin (LIPITOR) 10 MG tablet Take 1 tablet (10 mg total) by mouth daily.  30 tablet  3  . Blood Glucose Monitoring Suppl (ACCU-CHEK COMPACT CARE KIT) KIT by Does not apply route as directed.      . butalbital-acetaminophen-caffeine (FIORICET) 50-325-40 MG  per tablet Take 1-2 tablets by mouth every 6 (six) hours as needed for headache.  20 tablet  0  . cetirizine (ZYRTEC) 10 MG tablet Take 1 tablet (10 mg total) by mouth daily.  30 tablet  11  . clopidogrel (PLAVIX) 75 MG tablet Take 75 mg by mouth daily.      . clotrimazole-betamethasone (LOTRISONE) cream Apply 1 application topically 2 (two) times daily.      . diazepam (VALIUM) 5 MG tablet Take 1 tablet (5 mg total) by mouth every 12 (twelve) hours as needed for anxiety.  30 tablet  1  . FENOFIBRATE PO Take 135 mg by mouth daily.      . fluconazole (DIFLUCAN) 150 MG tablet Take 1 tablet (150 mg total) by mouth once a week.  2 tablet  1  . insulin glargine (LANTUS) 100 UNIT/ML injection Inject 85 Units into the skin at bedtime.      . insulin lispro (HUMALOG) 100 UNIT/ML injection Inject into the skin. SLIDING SCALE      . levalbuterol (XOPENEX) 1.25 MG/3ML nebulizer solution Take 1.25 mg by nebulization every 4 (four) hours as needed for wheezing.      Marland Kitchen levocetirizine (XYZAL) 5 MG tablet Take 5 mg by mouth every evening.      . meclizine (ANTIVERT) 25 MG tablet Take 25 mg by mouth every 4 (four) hours as needed for dizziness.      . metoprolol succinate (TOPROL-XL) 50 MG 24 hr tablet Take 50 mg by mouth daily. Take with or immediately  following a meal.      . montelukast (SINGULAIR) 10 MG tablet Take 1 tablet (10 mg total) by mouth at bedtime.  30 tablet  3  . pantoprazole (PROTONIX) 40 MG tablet Take 40 mg by mouth daily.      . Pitavastatin Calcium (LIVALO) 4 MG TABS Take 1 tablet by mouth at bedtime.      . pregabalin (LYRICA) 50 MG capsule Take 100 mg by mouth 3 (three) times daily.      . promethazine (PHENERGAN) 12.5 MG tablet Take 12.5 mg by mouth every 6 (six) hours as needed for nausea.      Marland Kitchen Respiratory Therapy Supplies (FLUTTER) DEVI Use as directed  1 each  0  . triamcinolone cream (KENALOG) 0.1 % Apply 1 application topically 2 (two) times daily.      Marland Kitchen venlafaxine (EFFEXOR) 50 MG  tablet Take 1 tablet (50 mg total) by mouth 2 (two) times daily with a meal.  60 tablet  1  . verapamil (VERELAN PM) 180 MG 24 hr capsule Take 180 mg by mouth at bedtime.       No current facility-administered medications on file prior to visit.    Allergies  Allergen Reactions  . Aspirin   . Atarax [Hydroxyzine]   . Baclofen   . Ciprofloxacin   . Iodine Tincture [Iodine]   . Isocal [Alitraq]   . Lyrica [Pregabalin]   . Metformin Hcl   . Methocarbamol   . Molds & Smuts   . Neurontin [Gabapentin]   . Nsaids   . Other     Grass and tress  . Penicillins   . Soma [Carisoprodol]     Family History  Problem Relation Age of Onset  . Emphysema Father     was a smoker  . Heart disease Father   . Heart attack Father   . Asthma Mother   . Heart disease Mother     catherization  . Cancer Mother     skin   . Hyperlipidemia Mother   . Hypertension Mother   . Heart attack Mother   . Thyroid disease Sister   . Heart disease Brother   . Kidney Stones Brother   . Cancer Sister     breast- milk duct  . Asthma Sister   . Heart disease Brother   . Kidney Stones Brother   . Heart disease Brother   . Heart murmur Brother   . Heart murmur Brother   . Kidney Stones Brother     History   Social History  . Marital Status: Single    Spouse Name: N/A    Number of Children: N/A  . Years of Education: N/A   Occupational History  . Retired     Social History Main Topics  . Smoking status: Never Smoker   . Smokeless tobacco: Never Used  . Alcohol Use: No  . Drug Use: No  . Sexual Activity: No   Other Topics Concern  . None   Social History Narrative  . None   Review of Systems - See HPI.  All other ROS are negative.  Filed Vitals:   02/28/13 1107  BP: 110/72  Pulse: 104  Temp: 98.1 F (36.7 C)  Resp: 16   Physical Exam  Vitals reviewed. Constitutional: She is oriented to person, place, and time and well-developed, well-nourished, and in no distress.  HENT:   Head: Normocephalic and atraumatic.  Right Ear: External ear normal.  Left Ear: External ear  normal.  Nose: Nose normal.  Mouth/Throat: Oropharynx is clear and moist. No oropharyngeal exudate.  Tympanic membranes within normal limits.  Tenderness to percussion over sinuses noted on examination.    Eyes: Conjunctivae are normal.  No photophobia noted with light examination of eyes.  Neck: Neck supple.  Cardiovascular: Normal rate, regular rhythm, normal heart sounds and intact distal pulses.   Pulmonary/Chest: Effort normal. She has no wheezes. She has no rales. She exhibits tenderness.  Decreased breath sounds of RLL.  Lymphadenopathy:    She has no cervical adenopathy.  Neurological: She is alert and oriented to person, place, and time. No cranial nerve deficit.  Skin: Skin is warm and dry. No rash noted.  Psychiatric: Affect normal.   Recent Results (from the past 2160 hour(s))  CULTURE, URINE COMPREHENSIVE     Status: None   Collection Time    12/24/12  1:49 PM      Result Value Range   Culture ESCHERICHIA COLI     Colony Count 50,000 COLONIES/ML     Organism ID, Bacteria ESCHERICHIA COLI    URINALYSIS, ROUTINE W REFLEX MICROSCOPIC     Status: Abnormal   Collection Time    12/24/12  1:49 PM      Result Value Range   Color, Urine YELLOW  YELLOW   APPearance CLOUDY (*) CLEAR   Specific Gravity, Urine >1.030 (*) 1.005 - 1.030   pH 5.5  5.0 - 8.0   Glucose, UA > 1000 (*) NEG mg/dL   Bilirubin Urine NEG  NEG   Ketones, ur NEG  NEG mg/dL   Hgb urine dipstick TRACE (*) NEG   Protein, ur NEG  NEG mg/dL   Urobilinogen, UA 0.2  0.0 - 1.0 mg/dL   Nitrite NEG  NEG   Leukocytes, UA NEG  NEG  URINALYSIS, MICROSCOPIC ONLY     Status: Abnormal   Collection Time    12/24/12  1:49 PM      Result Value Range   Squamous Epithelial / LPF RARE  RARE   Crystals Calcium Oxalate crystals noted  NONE SEEN   Casts NONE SEEN  NONE SEEN   WBC, UA 0-2  <3 WBC/hpf   RBC / HPF 0-2  <3 RBC/hpf    Bacteria, UA FEW (*) RARE  POCT URINALYSIS DIPSTICK     Status: Abnormal   Collection Time    12/24/12  1:50 PM      Result Value Range   Color, UA dark gold     Comment: Odorous   Clarity, UA cloudy     Glucose, UA 04/1998 or More     Comment: Extreme   Bilirubin, UA small     Ketones, UA neg     Spec Grav, UA >=1.030     Blood, UA Moderate +++     Comment: Hemolyzed   pH, UA 6.0     Protein, UA Trace     Urobilinogen, UA 0.2     Nitrite, UA neg     Leukocytes, UA Negative    CBC WITH DIFFERENTIAL     Status: Abnormal   Collection Time    12/24/12  2:20 PM      Result Value Range   WBC 7.4  4.0 - 10.5 K/uL   RBC 5.15 (*) 3.87 - 5.11 MIL/uL   Hemoglobin 14.7  12.0 - 15.0 g/dL   HCT 16.1  09.6 - 04.5 %   MCV 84.3  78.0 - 100.0 fL   MCH  28.5  26.0 - 34.0 pg   MCHC 33.9  30.0 - 36.0 g/dL   RDW 16.1  09.6 - 04.5 %   Platelets 347  150 - 400 K/uL   Neutrophils Relative % 61  43 - 77 %   Neutro Abs 4.6  1.7 - 7.7 K/uL   Lymphocytes Relative 26  12 - 46 %   Lymphs Abs 1.9  0.7 - 4.0 K/uL   Monocytes Relative 9  3 - 12 %   Monocytes Absolute 0.7  0.1 - 1.0 K/uL   Eosinophils Relative 4  0 - 5 %   Eosinophils Absolute 0.3  0.0 - 0.7 K/uL   Basophils Relative 0  0 - 1 %   Basophils Absolute 0.0  0.0 - 0.1 K/uL   Smear Review Criteria for review not met    TSH     Status: None   Collection Time    12/24/12  2:20 PM      Result Value Range   TSH 2.273  0.350 - 4.500 uIU/mL  COMPREHENSIVE METABOLIC PANEL     Status: Abnormal   Collection Time    12/24/12  2:20 PM      Result Value Range   Sodium 141  135 - 145 mEq/L   Potassium 4.0  3.5 - 5.3 mEq/L   Chloride 104  96 - 112 mEq/L   CO2 29  19 - 32 mEq/L   Glucose, Bld 185 (*) 70 - 99 mg/dL   BUN 11  6 - 23 mg/dL   Creat 4.09  8.11 - 9.14 mg/dL   Total Bilirubin 0.4  0.3 - 1.2 mg/dL   Alkaline Phosphatase 84  39 - 117 U/L   AST 14  0 - 37 U/L   ALT 9  0 - 35 U/L   Total Protein 6.6  6.0 - 8.3 g/dL   Albumin 3.7   3.5 - 5.2 g/dL   Calcium 9.3  8.4 - 78.2 mg/dL  CBC     Status: Abnormal   Collection Time    01/29/13  3:46 PM      Result Value Range   WBC 7.0  4.0 - 10.5 K/uL   RBC 5.34 (*) 3.87 - 5.11 MIL/uL   Hemoglobin 15.5 (*) 12.0 - 15.0 g/dL   HCT 95.6 (*) 21.3 - 08.6 %   MCV 87.6  78.0 - 100.0 fL   MCH 29.0  26.0 - 34.0 pg   MCHC 33.1  30.0 - 36.0 g/dL   RDW 57.8  46.9 - 62.9 %   Platelets 315  150 - 400 K/uL  RENAL FUNCTION PANEL     Status: Abnormal   Collection Time    01/29/13  3:46 PM      Result Value Range   Sodium 133 (*) 135 - 145 mEq/L   Potassium 4.5  3.5 - 5.3 mEq/L   Chloride 97  96 - 112 mEq/L   CO2 25  19 - 32 mEq/L   Glucose, Bld 404 (*) 70 - 99 mg/dL   BUN 17  6 - 23 mg/dL   Creat 5.28  4.13 - 2.44 mg/dL   Albumin 4.1  3.5 - 5.2 g/dL   Calcium 9.9  8.4 - 01.0 mg/dL   Phosphorus 4.3  2.3 - 4.6 mg/dL  HEPATIC FUNCTION PANEL     Status: None   Collection Time    01/29/13  3:46 PM      Result Value Range  Total Bilirubin 0.5  0.3 - 1.2 mg/dL   Bilirubin, Direct 0.1  0.0 - 0.3 mg/dL   Indirect Bilirubin 0.4  0.0 - 0.9 mg/dL   Alkaline Phosphatase 114  39 - 117 U/L   AST 13  0 - 37 U/L   ALT 11  0 - 35 U/L   Total Protein 7.2  6.0 - 8.3 g/dL   Albumin 4.1  3.5 - 5.2 g/dL  TSH     Status: None   Collection Time    01/29/13  3:46 PM      Result Value Range   TSH 1.933  0.350 - 4.500 uIU/mL  HEMOGLOBIN A1C     Status: Abnormal   Collection Time    01/29/13  3:46 PM      Result Value Range   Hemoglobin A1C 13.4 (*) <5.7 %   Comment:                                                                            According to the ADA Clinical Practice Recommendations for 2011, when     HbA1c is used as a screening test:             >=6.5%   Diagnostic of Diabetes Mellitus                (if abnormal result is confirmed)           5.7-6.4%   Increased risk of developing Diabetes Mellitus           References:Diagnosis and Classification of Diabetes  [place],Diabetes     Care,2011,34(Suppl 1):S62-S69 and Standards of Medical Care in             Diabetes - 2011,Diabetes Care,2011,34 (Suppl 1):S11-S61.         Mean Plasma Glucose 338 (*) <117 mg/dL  LIPID PANEL     Status: Abnormal   Collection Time    01/29/13  3:46 PM      Result Value Range   Cholesterol 318 (*) 0 - 200 mg/dL   Comment: ATP III Classification:           < 200        mg/dL        Desirable          200 - 239     mg/dL        Borderline High          >= 240        mg/dL        High         Triglycerides 588 (*) <150 mg/dL   HDL 44  >96 mg/dL   Total CHOL/HDL Ratio 7.2     VLDL NOT CALC  0 - 40 mg/dL   Comment:       Not calculated due to Triglyceride >400.     Suggest ordering Direct LDL (Unit Code: 04540).   LDL Cholesterol    0 - 99 mg/dL   Comment:       Not calculated due to Triglyceride >400.     Suggest ordering Direct LDL (Unit Code: 98119).  Total Cholesterol/HDL Ratio:CHD Risk                            Coronary Heart Disease Risk Table                                            Men       Women              1/2 Average Risk              3.4        3.3                  Average Risk              5.0        4.4               2X Average Risk              9.6        7.1               3X Average Risk             23.4       11.0     Use the calculated Patient Ratio above and the CHD Risk table      to determine the patient's CHD Risk.     ATP III Classification (LDL):           < 100        mg/dL         Optimal          100 - 129     mg/dL         Near or Above Optimal          130 - 159     mg/dL         Borderline High          160 - 189     mg/dL         High           > 190        mg/dL         Very High        URINALYSIS     Status: Abnormal   Collection Time    01/29/13  3:46 PM      Result Value Range   Color, Urine YELLOW  YELLOW   APPearance CLEAR  CLEAR   Specific Gravity, Urine >1.030 (*) 1.005 - 1.030   pH 5.0  5.0 - 8.0    Glucose, UA > 1000 (*) NEG mg/dL   Bilirubin Urine NEG  NEG   Ketones, ur NEG  NEG mg/dL   Hgb urine dipstick NEG  NEG   Protein, ur NEG  NEG mg/dL   Urobilinogen, UA 0.2  0.0 - 1.0 mg/dL   Nitrite NEG  NEG   Leukocytes, UA NEG  NEG  URINE CULTURE     Status: None   Collection Time    01/29/13  3:47 PM      Result Value Range   Culture KLEBSIELLA PNEUMONIAE     Comment: SOURCE: URINE   Colony Count 70,000 COLONIES/ML     Organism ID, Bacteria KLEBSIELLA PNEUMONIAE  Assessment/Plan: Migraine Rx Maxalt.  Only to be used for severe symptoms. Encourage increase fluid intake and rest during symptoms.  Recurrent sinusitis Worsened by concomitant migraines.  Giving failure 2 rounds of antibiotics, will obtain CT maxillofacial to take a look at her sinuses. Question bacterial versus inflammatory cause of symptoms. Will make urgent referral to otolaryngology for further evaluation and management. Encourage increase fluid intake. Rest. Saline nasal spray. Humidifier in bedroom. Given patient's concerns over pleuritic chest pain, will obtain a chest x-ray.

## 2013-02-28 NOTE — Telephone Encounter (Signed)
Will attempt Maxalt 5 mg.  EPIC shows a green checkmark for medication so should be more affordable.  Rx sent to pharmacy.

## 2013-02-28 NOTE — Telephone Encounter (Signed)
LMOM with contact name and number RE: change in medication and further provider instructions/SLS

## 2013-02-28 NOTE — Progress Notes (Signed)
Pre visit review using our clinic review tool, if applicable. No additional management support is needed unless otherwise documented below in the visit note/SLS  

## 2013-02-28 NOTE — Telephone Encounter (Signed)
Relpax, is not cover under insurance. Please advise.

## 2013-02-28 NOTE — Patient Instructions (Signed)
Please increase fluid intake.  Rest.  Saline nasal spray.  Continue with allergy medications.  Please obtain imaging.  I will call you with your results.  You will be contacted for your CT scan and by an ENT office for an appointment.  If symptoms acutely worsen, please proceed to the ER.  Eletriptan tablets What is this medicine? ELETRIPTAN (el ih TRIP tan) is used to treat migraines with or without aura. An aura is a strange feeling or visual disturbance that warns you of an attack. It is not used to prevent migraines. This medicine may be used for other purposes; ask your health care provider or pharmacist if you have questions. COMMON BRAND NAME(S): Relpax What should I tell my health care provider before I take this medicine? They need to know if you have any of these conditions: -bowel disease or colitis -diabetes -family history of heart disease -fast or irregular heart beat -heart or blood vessel disease, angina (chest pain), or previous heart attack -high blood pressure -high cholesterol -history of stroke, transient ischemic attacks (TIAs or mini-strokes), or intracranial bleeding -kidney or liver disease -overweight -poor circulation -postmenopausal or surgical removal of uterus and ovaries -Raynaud's disease -seizure disorder -an unusual or allergic reaction to eletriptan, other medicines, foods, dyes, or preservatives -pregnant or trying to get pregnant -breast-feeding How should I use this medicine? Take this medicine by mouth with a glass of water. Follow the directions on the prescription label. This medicine is taken at the first symptoms of a migraine. It is not for everyday use. If your migraine headache returns after one dose, you can take another dose as directed. You must leave at least 2 hours between doses, and do not take more than 40 mg as a single dose. Do not take more than 80 mg total in any 24 hour period. If there is no improvement at all after the first  dose, do not take a second dose without talking to your doctor or health care professional. Do not take your medicine more often than directed. Talk to your pediatrician regarding the use of this medicine in children. Special care may be needed. Overdosage: If you think you have taken too much of this medicine contact a poison control center or emergency room at once. NOTE: This medicine is only for you. Do not share this medicine with others. What if I miss a dose? This does not apply; this medicine is not for regular use. What may interact with this medicine? Do not take this medicine with any of the following medications: -amiodarone -amphetamine, dextroamphetamine, or cocaine -aprepitant -certain antibiotics like clarithromycin, erythromycin, troleandomycin -cimetidine -conivaptan -dalfopristin; quinupristin -dihydroergotamine, ergotamine, ergoloid mesylates, methysergide, or ergot-type medication - do not take within 24 hours of taking eletriptan. -diltiazem -feverfew -imatinib -medicines for fungal infections like fluconazole, itraconazole, ketoconazole, and voriconazole -medicines for HIV, AIDS -medicines for mental depression like fluvoxamine and nefazodone -mifepristone -other migraine medicines like almotriptan, sumatriptan, naratriptan, rizatriptan, zolmitriptan - do not take within 24 hours of taking eletriptan. -tryptophan -verapamil This medicine may also interact with the following medications: -medicines for mental depression, anxiety or mood problems This list may not describe all possible interactions. Give your health care provider a list of all the medicines, herbs, non-prescription drugs, or dietary supplements you use. Also tell them if you smoke, drink alcohol, or use illegal drugs. Some items may interact with your medicine. What should I watch for while using this medicine? Only take this medicine for a migraine headache.  Take it if you get warning symptoms or  at the start of a migraine attack. It is not for regular use to prevent migraine attacks. You may get drowsy or dizzy. Do not drive, use machinery, or do anything that needs mental alertness until you know how this medicine affects you. To reduce dizzy or fainting spells, do not sit or stand up quickly, especially if you are an older patient. Alcohol can increase drowsiness, dizziness and flushing. Avoid alcoholic drinks. Smoking cigarettes may increase the risk of heart-related side effects from using this medicine. What side effects may I notice from receiving this medicine? Side effects that you should report to your doctor or health care professional as soon as possible: -allergic reactions like skin rash, itching or hives, swelling of the face, lips, or tongue -bloody diarrhea -breathing problems -change in blood pressure -changes in vision -chest or throat pain, tightness -fast, slow, or irregular heart beat -seizures -stomach pain and cramping -tingling, pain, or numbness in the face, hands or feet Side effects that usually do not require medical attention (report to your doctor or health care professional if they continue or are bothersome): -diarrhea -drowsiness -feeling warm, flushing, or redness of the face -muscle pain or cramps -nausea, stomach upset -weak or tired This list may not describe all possible side effects. Call your doctor for medical advice about side effects. You may report side effects to FDA at 1-800-FDA-1088. Where should I keep my medicine? Keep out of the reach of children. Store at room temperature between 15 and 30 degrees C (59 and 86 degrees F). Throw away any unused medicine after the expiration date. NOTE: This sheet is a summary. It may not cover all possible information. If you have questions about this medicine, talk to your doctor, pharmacist, or health care provider.  2014, Elsevier/Gold Standard. (2007-05-28 14:44:20)

## 2013-03-01 ENCOUNTER — Telehealth: Payer: Self-pay | Admitting: Physician Assistant

## 2013-03-01 NOTE — Telephone Encounter (Signed)
Pt returning call

## 2013-03-01 NOTE — Telephone Encounter (Signed)
Attempt to reach pt via phone, cannot leave message; message states "sorry this mailbox is full, please call back later"/SLS

## 2013-03-01 NOTE — Telephone Encounter (Signed)
Please inform patient that her CT showed inflammation of her sinuses, worsened from CT in 2011.  Also shows inflammation of a nearby structure which could be causing obstruction, not allowing her sinuses to drain appropriately.  I do not feel another round of Doxycycline would be beneficial given failure of 2 rounds of medication.  Unfortunately, giving her allergies to other antibiotics, her best bet is to get in with the ENT.  She should get a phone call from their office by today.

## 2013-03-02 DIAGNOSIS — J329 Chronic sinusitis, unspecified: Secondary | ICD-10-CM | POA: Insufficient documentation

## 2013-03-02 DIAGNOSIS — G43909 Migraine, unspecified, not intractable, without status migrainosus: Secondary | ICD-10-CM | POA: Insufficient documentation

## 2013-03-02 NOTE — Assessment & Plan Note (Signed)
Rx Maxalt.  Only to be used for severe symptoms. Encourage increase fluid intake and rest during symptoms.

## 2013-03-02 NOTE — Assessment & Plan Note (Signed)
Worsened by concomitant migraines.  Giving failure 2 rounds of antibiotics, will obtain CT maxillofacial to take a look at her sinuses. Question bacterial versus inflammatory cause of symptoms. Will make urgent referral to otolaryngology for further evaluation and management. Encourage increase fluid intake. Rest. Saline nasal spray. Humidifier in bedroom. Given patient's concerns over pleuritic chest pain, will obtain a chest x-ray.

## 2013-03-05 ENCOUNTER — Ambulatory Visit: Payer: Medicare Other | Admitting: Internal Medicine

## 2013-03-13 ENCOUNTER — Telehealth: Payer: Self-pay | Admitting: *Deleted

## 2013-03-13 NOTE — Telephone Encounter (Signed)
Can we see if the patient was ever called back with results and instructions.  Patient called back when we working with Brittany Cannon.  I do not know if they spoke with patient and neglected to chart the encounter.

## 2013-03-13 NOTE — Telephone Encounter (Signed)
Patient reports that she cannot get nausea & vomiting with Migraines to cease and request refill on medication for Nausea [promethazine px previously]; reports that she is leaving to go out of town tomorrow evening, but refused appointment offered for tomorrow morning/SLS Please Advise on request.

## 2013-03-13 NOTE — Telephone Encounter (Signed)
Spoke w/patient; she has been to ENT and was placed on Biaxin x4 days ago/SLS

## 2013-03-14 NOTE — Telephone Encounter (Signed)
OK to try Promethazine 25 mg po bid prn n/v, disp #40, no rf

## 2013-03-15 MED ORDER — PROMETHAZINE HCL 25 MG PO TABS: 25.0000 mg | ORAL_TABLET | Freq: Two times a day (BID) | ORAL | Status: DC | PRN

## 2013-03-15 NOTE — Telephone Encounter (Signed)
Patient informed that Promethazine was sent to the pharmacy

## 2013-04-02 ENCOUNTER — Ambulatory Visit: Payer: Medicare Other | Admitting: Internal Medicine

## 2013-04-02 DIAGNOSIS — Z0289 Encounter for other administrative examinations: Secondary | ICD-10-CM

## 2013-04-10 ENCOUNTER — Ambulatory Visit (INDEPENDENT_AMBULATORY_CARE_PROVIDER_SITE_OTHER): Payer: Medicare HMO | Admitting: Physician Assistant

## 2013-04-10 ENCOUNTER — Encounter: Payer: Self-pay | Admitting: Physician Assistant

## 2013-04-10 VITALS — BP 116/70 | HR 102 | Temp 98.6°F | Resp 16 | Ht 68.0 in | Wt 206.0 lb

## 2013-04-10 DIAGNOSIS — E1159 Type 2 diabetes mellitus with other circulatory complications: Secondary | ICD-10-CM

## 2013-04-10 DIAGNOSIS — M545 Low back pain, unspecified: Secondary | ICD-10-CM | POA: Insufficient documentation

## 2013-04-10 DIAGNOSIS — R3919 Other difficulties with micturition: Secondary | ICD-10-CM

## 2013-04-10 DIAGNOSIS — R81 Glycosuria: Secondary | ICD-10-CM

## 2013-04-10 DIAGNOSIS — E119 Type 2 diabetes mellitus without complications: Secondary | ICD-10-CM

## 2013-04-10 DIAGNOSIS — R39198 Other difficulties with micturition: Secondary | ICD-10-CM

## 2013-04-10 DIAGNOSIS — R300 Dysuria: Secondary | ICD-10-CM

## 2013-04-10 LAB — BASIC METABOLIC PANEL
BUN: 12 mg/dL (ref 6–23)
CO2: 27 mEq/L (ref 19–32)
CREATININE: 0.69 mg/dL (ref 0.50–1.10)
Calcium: 9.9 mg/dL (ref 8.4–10.5)
Chloride: 96 mEq/L (ref 96–112)
Glucose, Bld: 392 mg/dL — ABNORMAL HIGH (ref 70–99)
Potassium: 4.4 mEq/L (ref 3.5–5.3)
SODIUM: 134 meq/L — AB (ref 135–145)

## 2013-04-10 LAB — CBC WITH DIFFERENTIAL/PLATELET
BASOS PCT: 0 % (ref 0–1)
Basophils Absolute: 0 10*3/uL (ref 0.0–0.1)
EOS PCT: 4 % (ref 0–5)
Eosinophils Absolute: 0.3 10*3/uL (ref 0.0–0.7)
HCT: 47 % — ABNORMAL HIGH (ref 36.0–46.0)
Hemoglobin: 15.8 g/dL — ABNORMAL HIGH (ref 12.0–15.0)
Lymphocytes Relative: 26 % (ref 12–46)
Lymphs Abs: 1.9 10*3/uL (ref 0.7–4.0)
MCH: 29 pg (ref 26.0–34.0)
MCHC: 33.6 g/dL (ref 30.0–36.0)
MCV: 86.2 fL (ref 78.0–100.0)
Monocytes Absolute: 0.6 10*3/uL (ref 0.1–1.0)
Monocytes Relative: 8 % (ref 3–12)
NEUTROS PCT: 62 % (ref 43–77)
Neutro Abs: 4.7 10*3/uL (ref 1.7–7.7)
PLATELETS: 363 10*3/uL (ref 150–400)
RBC: 5.45 MIL/uL — ABNORMAL HIGH (ref 3.87–5.11)
RDW: 13.8 % (ref 11.5–15.5)
WBC: 7.5 10*3/uL (ref 4.0–10.5)

## 2013-04-10 LAB — POCT URINALYSIS DIPSTICK
Bilirubin, UA: NEGATIVE
Leukocytes, UA: NEGATIVE
NITRITE UA: NEGATIVE
Protein, UA: NEGATIVE
RBC UA: NEGATIVE
SPEC GRAV UA: 1.02
UROBILINOGEN UA: 0.2
pH, UA: 5

## 2013-04-10 LAB — GLUCOSE, POCT (MANUAL RESULT ENTRY): POC GLUCOSE: 367 mg/dL — AB (ref 70–99)

## 2013-04-10 MED ORDER — TRAMADOL HCL 50 MG PO TABS: 50.0000 mg | ORAL_TABLET | Freq: Three times a day (TID) | ORAL | Status: DC | PRN

## 2013-04-10 MED ORDER — ACCU-CHEK COMPACT PLUS CARE KIT: 1.0000 | PACK | Status: DC

## 2013-04-10 MED ORDER — NITROFURANTOIN MONOHYD MACRO 100 MG PO CAPS: 100.0000 mg | ORAL_CAPSULE | Freq: Two times a day (BID) | ORAL | Status: DC

## 2013-04-10 NOTE — Assessment & Plan Note (Signed)
Seems unrelated to urinary symptoms.  Patient cannot tolerate muscle relaxer.  Rx Tramadol for severe pain.  Rest.  Topical Salon Pas or Aspercreme.  Return if symptoms not improving.

## 2013-04-10 NOTE — Progress Notes (Signed)
Patient presents to clinic today c/o 4 days of dysuria, urinary urgency and frequency and low back pain.  Patient denies fever, chills, nausea or vomiting.  Patient has a history of low back pain.  Denies recent injury or trauma.  Patient does have history of uncontrolled Type II Diabetes Mellitus.  Patient needs a new glucometer as her old one broke. Has not checked her glucose level in over 2 weeks.    Past Medical History  Diagnosis Date  . Heart palpitations   . Diabetes   . Stroke   . Hyperlipidemia   . Kidney stones   . TMJ (dislocation of temporomandibular joint)   . Asthma   . Hypertension   . Migraine syndrome 02/03/2013  . Allergic state 02/03/2013  . Obesity, unspecified 02/03/2013  . Insomnia 02/03/2013  . Diverticulosis 02/03/2013  . IBS (irritable bowel syndrome) 02/03/2013  . 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 Other and unspecified hyperlipidemia 02/03/2013  . Type II or unspecified type diabetes mellitus with peripheral circulatory disorders, uncontrolled(250.72) 02/03/2013    Current Outpatient Prescriptions on File Prior to Visit  Medication Sig Dispense Refill  . atorvastatin (LIPITOR) 10 MG tablet Take 1 tablet (10 mg total) by mouth daily.  30 tablet  3  . butalbital-acetaminophen-caffeine (FIORICET) 50-325-40 MG per tablet Take 1-2 tablets by mouth every 6 (six) hours as needed for headache.  20 tablet  0  . cetirizine (ZYRTEC) 10 MG tablet Take 1 tablet (10 mg total) by mouth daily.  30 tablet  11  . clopidogrel (PLAVIX) 75 MG tablet Take 75 mg by mouth daily.      . clotrimazole-betamethasone (LOTRISONE) cream Apply 1 application topically 2 (two) times daily.      . diazepam (VALIUM) 5 MG tablet Take 1 tablet (5 mg total) by mouth every 12 (twelve) hours as needed for anxiety.  30 tablet  1  . FENOFIBRATE PO Take 135 mg by mouth daily.      . fluconazole (DIFLUCAN) 150 MG tablet Take  1 tablet (150 mg total) by mouth once a week.  2 tablet  1  . insulin glargine (LANTUS) 100 UNIT/ML injection Inject 85 Units into the skin at bedtime.      . insulin lispro (HUMALOG) 100 UNIT/ML injection Inject into the skin. SLIDING SCALE      . levalbuterol (XOPENEX) 1.25 MG/3ML nebulizer solution Take 1.25 mg by nebulization every 4 (four) hours as needed for wheezing.      Marland Kitchen levocetirizine (XYZAL) 5 MG tablet Take 5 mg by mouth every evening.      . meclizine (ANTIVERT) 25 MG tablet Take 25 mg by mouth every 4 (four) hours as needed for dizziness.      . metoprolol succinate (TOPROL-XL) 50 MG 24 hr tablet Take 50 mg by mouth daily. Take with or immediately following a meal.      . montelukast (SINGULAIR) 10 MG tablet Take 1 tablet (10 mg total) by mouth at bedtime.  30 tablet  3  . pantoprazole (PROTONIX) 40 MG tablet Take 40 mg by mouth daily.      . Pitavastatin Calcium (LIVALO) 4 MG TABS Take 1 tablet by mouth at bedtime.      . pregabalin (LYRICA) 50 MG capsule Take 100 mg by mouth 3 (three) times daily.      . promethazine (PHENERGAN) 12.5 MG tablet Take 12.5 mg by mouth every 6 (six) hours as  needed for nausea.      . promethazine (PHENERGAN) 25 MG tablet Take 1 tablet (25 mg total) by mouth 2 (two) times daily as needed for nausea or vomiting.  40 tablet  0  . Respiratory Therapy Supplies (FLUTTER) DEVI Use as directed  1 each  0  . rizatriptan (MAXALT-MLT) 5 MG disintegrating tablet Take 1 tablet (5 mg total) by mouth as needed for migraine. May repeat in 2 hours if needed  10 tablet  0  . triamcinolone cream (KENALOG) 0.1 % Apply 1 application topically 2 (two) times daily.      Marland Kitchen venlafaxine (EFFEXOR) 50 MG tablet Take 1 tablet (50 mg total) by mouth 2 (two) times daily with a meal.  60 tablet  1  . verapamil (VERELAN PM) 180 MG 24 hr capsule Take 180 mg by mouth at bedtime.       No current facility-administered medications on file prior to visit.    Allergies  Allergen  Reactions  . Aspirin   . Atarax [Hydroxyzine]   . Baclofen   . Ciprofloxacin   . Iodine Tincture [Iodine]   . Isocal [Alitraq]   . Lyrica [Pregabalin]   . Metformin Hcl   . Methocarbamol   . Molds & Smuts   . Neurontin [Gabapentin]   . Nsaids   . Other     Grass and tress  . Penicillins   . Soma [Carisoprodol]     Family History  Problem Relation Age of Onset  . Emphysema Father     was a smoker  . Heart disease Father   . Heart attack Father   . Asthma Mother   . Heart disease Mother     catherization  . Cancer Mother     skin   . Hyperlipidemia Mother   . Hypertension Mother   . Heart attack Mother   . Thyroid disease Sister   . Heart disease Brother   . Kidney Stones Brother   . Cancer Sister     breast- milk duct  . Asthma Sister   . Heart disease Brother   . Kidney Stones Brother   . Heart disease Brother   . Heart murmur Brother   . Heart murmur Brother   . Kidney Stones Brother     History   Social History  . Marital Status: Single    Spouse Name: N/A    Number of Children: N/A  . Years of Education: N/A   Occupational History  . Retired     Social History Main Topics  . Smoking status: Never Smoker   . Smokeless tobacco: Never Used  . Alcohol Use: No  . Drug Use: No  . Sexual Activity: No   Other Topics Concern  . None   Social History Narrative  . None   Review of Systems - See HPI.  All other ROS are negative.  Filed Vitals:   04/10/13 1409  BP: 116/70  Pulse: 102  Temp: 98.6 F (37 C)  Resp: 16   Physical Exam  Constitutional: She is oriented to person, place, and time and well-developed, well-nourished, and in no distress.  HENT:  Head: Normocephalic and atraumatic.  Eyes: Conjunctivae are normal. Pupils are equal, round, and reactive to light.  Neck: Neck supple.  Cardiovascular: Normal rate, regular rhythm, normal heart sounds and intact distal pulses.   Pulmonary/Chest: Effort normal and breath sounds normal.   Negative CVA tenderness  Abdominal: Soft. Bowel sounds are normal. She exhibits no  distension and no mass. There is no rebound and no guarding.  Positive suprapubic tenderness  Musculoskeletal:       Lumbar back: She exhibits tenderness, pain and spasm. She exhibits normal range of motion, no bony tenderness, no swelling, no edema, no deformity and no laceration.  Mostly right-sided tenderness and spasm  Lymphadenopathy:    She has no cervical adenopathy.  Neurological: She is alert and oriented to person, place, and time.  Skin: Skin is warm and dry. No rash noted.  Psychiatric: Affect normal.    Recent Results (from the past 2160 hour(s))  CBC     Status: Abnormal   Collection Time    01/29/13  3:46 PM      Result Value Range   WBC 7.0  4.0 - 10.5 K/uL   RBC 5.34 (*) 3.87 - 5.11 MIL/uL   Hemoglobin 15.5 (*) 12.0 - 15.0 g/dL   HCT 40.9 (*) 81.1 - 91.4 %   MCV 87.6  78.0 - 100.0 fL   MCH 29.0  26.0 - 34.0 pg   MCHC 33.1  30.0 - 36.0 g/dL   RDW 78.2  95.6 - 21.3 %   Platelets 315  150 - 400 K/uL  RENAL FUNCTION PANEL     Status: Abnormal   Collection Time    01/29/13  3:46 PM      Result Value Range   Sodium 133 (*) 135 - 145 mEq/L   Potassium 4.5  3.5 - 5.3 mEq/L   Chloride 97  96 - 112 mEq/L   CO2 25  19 - 32 mEq/L   Glucose, Bld 404 (*) 70 - 99 mg/dL   BUN 17  6 - 23 mg/dL   Creat 0.86  5.78 - 4.69 mg/dL   Albumin 4.1  3.5 - 5.2 g/dL   Calcium 9.9  8.4 - 62.9 mg/dL   Phosphorus 4.3  2.3 - 4.6 mg/dL  HEPATIC FUNCTION PANEL     Status: None   Collection Time    01/29/13  3:46 PM      Result Value Range   Total Bilirubin 0.5  0.3 - 1.2 mg/dL   Bilirubin, Direct 0.1  0.0 - 0.3 mg/dL   Indirect Bilirubin 0.4  0.0 - 0.9 mg/dL   Alkaline Phosphatase 114  39 - 117 U/L   AST 13  0 - 37 U/L   ALT 11  0 - 35 U/L   Total Protein 7.2  6.0 - 8.3 g/dL   Albumin 4.1  3.5 - 5.2 g/dL  TSH     Status: None   Collection Time    01/29/13  3:46 PM      Result Value Range   TSH  1.933  0.350 - 4.500 uIU/mL  HEMOGLOBIN A1C     Status: Abnormal   Collection Time    01/29/13  3:46 PM      Result Value Range   Hemoglobin A1C 13.4 (*) <5.7 %   Comment:                                                                            According to the ADA Clinical Practice Recommendations for 2011, when  HbA1c is used as a screening test:             >=6.5%   Diagnostic of Diabetes Mellitus                (if abnormal result is confirmed)           5.7-6.4%   Increased risk of developing Diabetes Mellitus           References:Diagnosis and Classification of Diabetes Mellitus,Diabetes     Care,2011,34(Suppl 1):S62-S69 and Standards of Medical Care in             Diabetes - 2011,Diabetes Care,2011,34 (Suppl 1):S11-S61.         Mean Plasma Glucose 338 (*) <117 mg/dL  LIPID PANEL     Status: Abnormal   Collection Time    01/29/13  3:46 PM      Result Value Range   Cholesterol 318 (*) 0 - 200 mg/dL   Comment: ATP III Classification:           < 200        mg/dL        Desirable          200 - 239     mg/dL        Borderline High          >= 240        mg/dL        High         Triglycerides 588 (*) <150 mg/dL   HDL 44  >16>39 mg/dL   Total CHOL/HDL Ratio 7.2     VLDL NOT CALC  0 - 40 mg/dL   Comment:       Not calculated due to Triglyceride >400.     Suggest ordering Direct LDL (Unit Code: 1096081033).   LDL Cholesterol    0 - 99 mg/dL   Comment:       Not calculated due to Triglyceride >400.     Suggest ordering Direct LDL (Unit Code: 4540981033).           Total Cholesterol/HDL Ratio:CHD Risk                            Coronary Heart Disease Risk Table                                            Men       Women              1/2 Average Risk              3.4        3.3                  Average Risk              5.0        4.4               2X Average Risk              9.6        7.1               3X Average Risk             23.4  11.0     Use the calculated Patient  Ratio above and the CHD Risk table      to determine the patient's CHD Risk.     ATP III Classification (LDL):           < 100        mg/dL         Optimal          100 - 129     mg/dL         Near or Above Optimal          130 - 159     mg/dL         Borderline High          160 - 189     mg/dL         High           > 190        mg/dL         Very High        URINALYSIS     Status: Abnormal   Collection Time    01/29/13  3:46 PM      Result Value Range   Color, Urine YELLOW  YELLOW   APPearance CLEAR  CLEAR   Specific Gravity, Urine >1.030 (*) 1.005 - 1.030   pH 5.0  5.0 - 8.0   Glucose, UA > 1000 (*) NEG mg/dL   Bilirubin Urine NEG  NEG   Ketones, ur NEG  NEG mg/dL   Hgb urine dipstick NEG  NEG   Protein, ur NEG  NEG mg/dL   Urobilinogen, UA 0.2  0.0 - 1.0 mg/dL   Nitrite NEG  NEG   Leukocytes, UA NEG  NEG  URINE CULTURE     Status: None   Collection Time    01/29/13  3:47 PM      Result Value Range   Culture KLEBSIELLA PNEUMONIAE     Comment: SOURCE: URINE   Colony Count 70,000 COLONIES/ML     Organism ID, Bacteria KLEBSIELLA PNEUMONIAE    POCT URINALYSIS DIPSTICK     Status: None   Collection Time    04/10/13  2:25 PM      Result Value Range   Color, UA straw     Clarity, UA cloudy     Glucose, UA 2/200 or more     Bilirubin, UA neg     Ketones, UA Moderate     Spec Grav, UA 1.020     Blood, UA neg     pH, UA 5.0     Protein, UA neg     Urobilinogen, UA 0.2     Nitrite, UA neg     Leukocytes, UA Negative    GLUCOSE, POCT (MANUAL RESULT ENTRY)     Status: Abnormal   Collection Time    04/10/13  3:01 PM      Result Value Range   POC Glucose 367 (*) 70 - 99 mg/dl   Comment: Non-fasting    Assessment/Plan: No problem-specific assessment & plan notes found for this encounter.

## 2013-04-10 NOTE — Assessment & Plan Note (Signed)
Reordered Glucometer.  POC glucose was 371.  Patient has not been taking insulin as prescribed.  Patient instructed to take basal insulin as directed and follow sliding scale for bolus insulin. Will obtain BMP today.  Patient to return in 2 weeks for follow-up.  Monitor blood sugar at home.  Check 3 x day.

## 2013-04-10 NOTE — Progress Notes (Signed)
Pre visit review using our clinic review tool, if applicable. No additional management support is needed unless otherwise documented below in the visit note/SLS  

## 2013-04-10 NOTE — Patient Instructions (Signed)
Take medications as prescribed.  Increase fluid intake.  Take a cranberry supplement.  Apply a topical Aspercreme or Salon Pas to your lower back.  Take hot showers and let the hot water relax your muscles.  Avoid heavy lifting.  If symptoms are not improving or acutely worsen you need to return to the clinic.

## 2013-04-10 NOTE — Assessment & Plan Note (Signed)
Will send urine for micro and culture.  Giving patient's history and symptoms will empirically treat with macrobid.

## 2013-04-11 ENCOUNTER — Telehealth: Payer: Self-pay

## 2013-04-11 ENCOUNTER — Telehealth: Payer: Self-pay | Admitting: Family Medicine

## 2013-04-11 DIAGNOSIS — E119 Type 2 diabetes mellitus without complications: Secondary | ICD-10-CM

## 2013-04-11 LAB — URINALYSIS, MICROSCOPIC ONLY
CASTS: NONE SEEN
Crystals: NONE SEEN

## 2013-04-11 LAB — URINALYSIS, ROUTINE W REFLEX MICROSCOPIC
Bilirubin Urine: NEGATIVE
HGB URINE DIPSTICK: NEGATIVE
KETONES UR: 15 mg/dL — AB
Leukocytes, UA: NEGATIVE
NITRITE: NEGATIVE
Protein, ur: NEGATIVE mg/dL
Urobilinogen, UA: 0.2 mg/dL (ref 0.0–1.0)
pH: 5 (ref 5.0–8.0)

## 2013-04-11 NOTE — Telephone Encounter (Signed)
Patient states that she needs a 2 month supply of accu chek test strips and true test test strips. [facility] on [address]

## 2013-04-11 NOTE — Telephone Encounter (Signed)
Relevant patient education assigned to patient using Emmi. ° °

## 2013-04-12 LAB — CULTURE, URINE COMPREHENSIVE: Colony Count: >=100000

## 2013-04-12 MED ORDER — GLUCOSE BLOOD VI STRP: ORAL_STRIP | Status: AC

## 2013-04-12 NOTE — Telephone Encounter (Signed)
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

## 2013-04-15 ENCOUNTER — Telehealth: Payer: Self-pay | Admitting: *Deleted

## 2013-04-15 MED ORDER — SULFAMETHOXAZOLE-TMP DS 800-160 MG PO TABS: 1.0000 | ORAL_TABLET | Freq: Two times a day (BID) | ORAL | Status: DC

## 2013-04-15 NOTE — Telephone Encounter (Signed)
Patient informed, understood & states that she still has symptoms, but she also "knows that she is passing some kidney stones too"; informed that we would send new ABX Rx for Bactrim to pharmacy and to call if no better for re-evaluation/SLS

## 2013-04-15 NOTE — Telephone Encounter (Signed)
Message copied by Regis BillSCATES, SHARON L on Mon Apr 15, 2013 11:00 AM ------      Message from: Marcelline MatesMARTIN, WILLIAM      Created: Sat Apr 13, 2013  9:23 PM       Urine culture shows definite infection -- If she is not feeling better with current antibiotic, we will need to change therapy -- will need and Rx for Bactrim ------

## 2013-04-22 ENCOUNTER — Telehealth: Payer: Self-pay | Admitting: Family Medicine

## 2013-04-22 DIAGNOSIS — E119 Type 2 diabetes mellitus without complications: Secondary | ICD-10-CM

## 2013-04-22 NOTE — Telephone Encounter (Signed)
accu-chek aviva plus meter  accu-chek softclix lancets  accu-chek aviva plus test strip

## 2013-04-23 NOTE — Telephone Encounter (Signed)
Please advise refill? 

## 2013-04-24 NOTE — Telephone Encounter (Signed)
OK to prescribe glucometer with lancets and test strips. She is insulin dependent so she can have testing tid, disp 30 day supply with 5 rf or 90 day supply with 1 rf

## 2013-04-25 NOTE — Telephone Encounter (Signed)
Patient called back stating that she needs refills of all medications sent to [facility]. 90 days supply

## 2013-04-26 ENCOUNTER — Ambulatory Visit: Payer: Medicare Other | Admitting: Physician Assistant

## 2013-04-26 MED ORDER — GLUCOSE BLOOD VI STRP: ORAL_STRIP | Status: DC

## 2013-04-26 MED ORDER — ACCU-CHEK COMPACT PLUS CARE KIT: PACK | Status: DC

## 2013-04-26 MED ORDER — ACCU-CHEK SOFTCLIX LANCETS MISC: Status: DC

## 2013-04-26 MED ORDER — GLUCOSE BLOOD VI STRP: ORAL_STRIP | Status: AC

## 2013-04-26 MED ORDER — ACCU-CHEK COMPACT PLUS CARE KIT: PACK | Status: AC

## 2013-04-26 MED ORDER — ACCU-CHEK SOFTCLIX LANCETS MISC: Status: AC

## 2013-04-26 NOTE — Telephone Encounter (Signed)
Rx request to mail order pharmacy 90-day supply w/1 RF per provider/SLS

## 2013-05-03 ENCOUNTER — Telehealth: Payer: Self-pay | Admitting: Family Medicine

## 2013-05-03 DIAGNOSIS — N390 Urinary tract infection, site not specified: Secondary | ICD-10-CM

## 2013-05-03 DIAGNOSIS — G43909 Migraine, unspecified, not intractable, without status migrainosus: Secondary | ICD-10-CM

## 2013-05-03 NOTE — Telephone Encounter (Signed)
Requesting refill on butalbital-acetaminophen-caffeine (FIORICET) 50-325-40 MG, needs today. Also requesting referral to see the urologist for UA

## 2013-05-03 NOTE — Telephone Encounter (Signed)
Left message for pt to return my call.

## 2013-05-06 MED ORDER — BUTALBITAL-APAP-CAFFEINE 50-325-40 MG PO TABS: 1.0000 | ORAL_TABLET | Freq: Four times a day (QID) | ORAL | Status: AC | PRN

## 2013-05-06 NOTE — Telephone Encounter (Signed)
Ok

## 2013-05-06 NOTE — Telephone Encounter (Signed)
Is it ok to sent refill of butalbital, #20 as previous rx?

## 2013-05-06 NOTE — Telephone Encounter (Signed)
Spoke with pt. She states she just completed macrobid for uti and symptoms have not improved. This is the 4th infection per pt since November. Pt states she is still having dysuria. Also reports history of kidney stones and wants referral to Urologist.  Pt's last refill of butalbital was 01/29/13, #20. Is it ok to send refill? Please advise.

## 2013-05-06 NOTE — Telephone Encounter (Signed)
Rx faxed to pharmacy.  Attempted to contact pt and was unable to leave a message as "voice mailbox is full".

## 2013-05-06 NOTE — Telephone Encounter (Signed)
Dr. Abner GreenspanBlyth sent rx for bactrim, did she take this?  If not she should start.  If she did take and her symptoms are note improved she needs follow up in the office.  I will place urology referral, however it will likely be several weeks before she is seen by them.

## 2013-05-07 NOTE — Telephone Encounter (Signed)
Notified pt. She states she has appt with urology on Thursday and will address with them.

## 2013-05-07 NOTE — Telephone Encounter (Signed)
Notified pt. She states she completed bactrim and macrobid without any improvement of symptoms. Pt is refusing office visit as she "doesn't  Understand why she should come in to the office to be put on the same thing she has already tried." I advised pt that it was never indicated that she would be prescribed the same medication and unless she has allergies to the antibiotics that would treat her uti then she would probably be given a different antibiotic. Pt then states that she has a cluster headache that was caused by us not getting her butalbital called in on Friday and she is unable to drive today. Pt then wanted to know if we would call her in something for her pain she is experiencing from her UTI symptoms. I advised pt that she would need to be seen in the office for further evaluation before medication could be prescribed. Pt remains upset and refuses appt.

## 2013-05-07 NOTE — Telephone Encounter (Signed)
She can get otc AZO for temporary relief of urinary symptoms.  She needs to be seen because she needs a repeat urine culture. We need to make sure we pick the best antibiotic to treat her and the only way to know is by culture.  This is especially important since she has failed bactrim and macrobid.

## 2013-05-09 ENCOUNTER — Telehealth: Payer: Self-pay | Admitting: Family Medicine

## 2013-05-09 ENCOUNTER — Other Ambulatory Visit: Payer: Self-pay | Admitting: Family Medicine

## 2013-05-09 DIAGNOSIS — N390 Urinary tract infection, site not specified: Secondary | ICD-10-CM

## 2013-05-09 NOTE — Telephone Encounter (Signed)
Patient called in stating that she is now going to [facility], not [facility]. She states that she now has [facility] now and will need a referral. Patient states that her appointment is tomorrow. Please place referral. Thanks.

## 2013-05-16 ENCOUNTER — Ambulatory Visit: Payer: Medicare Other | Admitting: Family Medicine

## 2013-05-20 ENCOUNTER — Ambulatory Visit (INDEPENDENT_AMBULATORY_CARE_PROVIDER_SITE_OTHER): Payer: Medicare Other | Admitting: Family Medicine

## 2013-05-20 ENCOUNTER — Ambulatory Visit (HOSPITAL_BASED_OUTPATIENT_CLINIC_OR_DEPARTMENT_OTHER)
Admission: RE | Admit: 2013-05-20 | Discharge: 2013-05-20 | Disposition: A | Discharge status: Home / Self Care | Payer: Medicare HMO | Source: Ambulatory Visit | Attending: Family Medicine | Admitting: Family Medicine

## 2013-05-20 ENCOUNTER — Encounter: Payer: Self-pay | Admitting: Family Medicine

## 2013-05-20 VITALS — BP 100/68 | HR 104 | Temp 97.8°F | Ht 68.0 in | Wt 203.1 lb

## 2013-05-20 DIAGNOSIS — E119 Type 2 diabetes mellitus without complications: Secondary | ICD-10-CM

## 2013-05-20 DIAGNOSIS — M549 Dorsalgia, unspecified: Secondary | ICD-10-CM

## 2013-05-20 DIAGNOSIS — M51379 Other intervertebral disc degeneration, lumbosacral region without mention of lumbar back pain or lower extremity pain: Secondary | ICD-10-CM | POA: Insufficient documentation

## 2013-05-20 DIAGNOSIS — M533 Sacrococcygeal disorders, not elsewhere classified: Secondary | ICD-10-CM | POA: Insufficient documentation

## 2013-05-20 DIAGNOSIS — N200 Calculus of kidney: Secondary | ICD-10-CM

## 2013-05-20 DIAGNOSIS — M545 Low back pain, unspecified: Secondary | ICD-10-CM | POA: Insufficient documentation

## 2013-05-20 DIAGNOSIS — M5137 Other intervertebral disc degeneration, lumbosacral region: Secondary | ICD-10-CM | POA: Insufficient documentation

## 2013-05-20 DIAGNOSIS — I10 Essential (primary) hypertension: Secondary | ICD-10-CM

## 2013-05-20 DIAGNOSIS — M5410 Radiculopathy, site unspecified: Secondary | ICD-10-CM

## 2013-05-20 DIAGNOSIS — W19XXXA Unspecified fall, initial encounter: Secondary | ICD-10-CM | POA: Insufficient documentation

## 2013-05-20 DIAGNOSIS — IMO0002 Reserved for concepts with insufficient information to code with codable children: Secondary | ICD-10-CM

## 2013-05-20 MED ORDER — METHYLPREDNISOLONE 4 MG PO TABS: ORAL_TABLET | ORAL | Status: AC

## 2013-05-20 MED ORDER — TRAMADOL HCL 50 MG PO TABS: 50.0000 mg | ORAL_TABLET | Freq: Four times a day (QID) | ORAL | Status: DC | PRN

## 2013-05-20 MED ORDER — INSULIN GLARGINE 100 UNIT/ML ~~LOC~~ SOLN: 87.0000 [IU] | Freq: Every day | SUBCUTANEOUS | Status: DC

## 2013-05-20 NOTE — Progress Notes (Signed)
Pre visit review using our clinic review tool, if applicable. No additional management support is needed unless otherwise documented below in the visit note. 

## 2013-05-20 NOTE — Patient Instructions (Signed)
Back Pain, Adult Low back pain is very common. About 1 in 5 people have back pain.The cause of low back pain is rarely dangerous. The pain often gets better over time.About half of people with a sudden onset of back pain feel better in just 2 weeks. About 8 in 10 people feel better by 6 weeks.  CAUSES Some common causes of back pain include:  Strain of the muscles or ligaments supporting the spine.  Wear and tear (degeneration) of the spinal discs.  Arthritis.  Direct injury to the back. DIAGNOSIS Most of the time, the direct cause of low back pain is not known.However, back pain can be treated effectively even when the exact cause of the pain is unknown.Answering your caregiver's questions about your overall health and symptoms is one of the most accurate ways to make sure the cause of your pain is not dangerous. If your caregiver needs more information, he or she may order lab work or imaging tests (X-rays or MRIs).However, even if imaging tests show changes in your back, this usually does not require surgery. HOME CARE INSTRUCTIONS For many people, back pain returns.Since low back pain is rarely dangerous, it is often a condition that people can learn to manageon their own.   Remain active. It is stressful on the back to sit or stand in one place. Do not sit, drive, or stand in one place for more than 30 minutes at a time. Take short walks on level surfaces as soon as pain allows.Try to increase the length of time you walk each day.  Do not stay in bed.Resting more than 1 or 2 days can delay your recovery.  Do not avoid exercise or work.Your body is made to move.It is not dangerous to be active, even though your back may hurt.Your back will likely heal faster if you return to being active before your pain is gone.  Pay attention to your body when you bend and lift. Many people have less discomfortwhen lifting if they bend their knees, keep the load close to their bodies,and  avoid twisting. Often, the most comfortable positions are those that put less stress on your recovering back.  Find a comfortable position to sleep. Use a firm mattress and lie on your side with your knees slightly bent. If you lie on your back, put a pillow under your knees.  Only take over-the-counter or prescription medicines as directed by your caregiver. Over-the-counter medicines to reduce pain and inflammation are often the most helpful.Your caregiver may prescribe muscle relaxant drugs.These medicines help dull your pain so you can more quickly return to your normal activities and healthy exercise.  Put ice on the injured area.  Put ice in a plastic bag.  Place a towel between your skin and the bag.  Leave the ice on for 15-20 minutes, 03-04 times a day for the first 2 to 3 days. After that, ice and heat may be alternated to reduce pain and spasms.  Ask your caregiver about trying back exercises and gentle massage. This may be of some benefit.  Avoid feeling anxious or stressed.Stress increases muscle tension and can worsen back pain.It is important to recognize when you are anxious or stressed and learn ways to manage it.Exercise is a great option. SEEK MEDICAL CARE IF:  You have pain that is not relieved with rest or medicine.  You have pain that does not improve in 1 week.  You have new symptoms.  You are generally not feeling well. SEEK   IMMEDIATE MEDICAL CARE IF:   You have pain that radiates from your back into your legs.  You develop new bowel or bladder control problems.  You have unusual weakness or numbness in your arms or legs.  You develop nausea or vomiting.  You develop abdominal pain.  You feel faint. Document Released: 03/14/2005 Document Revised: 09/13/2011 Document Reviewed: 08/02/2010 [employer] Patient Information 2014 [employer].  

## 2013-05-26 ENCOUNTER — Encounter: Payer: Self-pay | Admitting: Family Medicine

## 2013-05-26 DIAGNOSIS — N200 Calculus of kidney: Secondary | ICD-10-CM | POA: Insufficient documentation

## 2013-05-26 NOTE — Assessment & Plan Note (Signed)
Well controlled, no changes 

## 2013-05-26 NOTE — Assessment & Plan Note (Signed)
Now following with urology asymptomatic today, maintain adequate hdyration

## 2013-05-26 NOTE — Assessment & Plan Note (Signed)
Fell on ice 5 days ago and has had an increase in her low bck pain with radiculopathy. xrays negative for acute process but given history and symptoms is referred to neurosurgery for further care. May use Tramadol prn and seek care if pain worsens.

## 2013-05-26 NOTE — Progress Notes (Signed)
Patient ID: Brittany Cannon, female   DOB: 02-Apr-1950, 63 y.o.   MRN: [id number] MYHA ARIZPE [id number] 24-Jun-1950 05/26/2013      Progress Note-Follow Up  Subjective  Chief Complaint  Chief Complaint  Patient presents with  . Fall    on ice, bruised tailbone    HPI  Patient is a 63 year old Caucasian female who is in today complaining of pain. She fell outside the skull: Since. She fell on her tailbone has had some pain ever since. Does have a history of surgery in her lumbar spine in the past. Her pain is not worsening but it's not resolving. She does have some radicular symptoms down both legs right is worst on left. No incontinence. No fevers, chest pain or palpitations. No shortness of breath. Taking medicaitons as prescribed  Past Medical History  Diagnosis Date  . Heart palpitations   . Diabetes   . Stroke   . Hyperlipidemia   . Kidney stones   . TMJ (dislocation of temporomandibular joint)   . Asthma   . Hypertension   . Migraine syndrome 02/03/2013  . Allergic state 02/03/2013  . Obesity, unspecified 02/03/2013  . Insomnia 02/03/2013  . Diverticulosis 02/03/2013  . IBS (irritable bowel syndrome) 02/03/2013  . 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 Other and unspecified hyperlipidemia 02/03/2013  . Type II or unspecified type diabetes mellitus with peripheral circulatory disorders, uncontrolled(250.72) 02/03/2013    Past Surgical History  Procedure Laterality Date  . Rotator cuff repair  x 2   . Cholecystectomy    . Colon surgery  x 3   . Vesicovaginal fistula closure w/ tah    . Appendectomy    . Abdominal hysterectomy  1990    total    Family History  Problem Relation Age of Onset  . Emphysema Father     was a smoker  . Heart disease Father   . Heart attack Father   . Asthma Mother   . Heart disease Mother     catherization  . Cancer Mother     skin   . Hyperlipidemia  Mother   . Hypertension Mother   . Heart attack Mother   . Thyroid disease Sister   . Heart disease Brother   . Kidney Stones Brother   . Cancer Sister     breast- milk duct  . Asthma Sister   . Heart disease Brother   . Kidney Stones Brother   . Heart disease Brother   . Heart murmur Brother   . Heart murmur Brother   . Kidney Stones Brother     History   Social History  . Marital Status: Single    Spouse Name: N/A    Number of Children: N/A  . Years of Education: N/A   Occupational History  . Retired     Social History Main Topics  . Smoking status: Never Smoker   . Smokeless tobacco: Never Used  . Alcohol Use: No  . Drug Use: No  . Sexual Activity: No   Other Topics Concern  . Not on file   Social History Narrative  . No narrative on file    Current Outpatient Prescriptions on File Prior to Visit  Medication Sig Dispense Refill  . ACCU-CHEK SOFTCLIX LANCETS lancets Testing TID Daily Dx:250.00  300 each  1  . atorvastatin (LIPITOR) 10 MG tablet Take 1 tablet (10 mg total) by mouth daily.  [place]  tablet  3  . Blood Glucose Monitoring Suppl (ACCU-CHEK COMPACT CARE KIT) KIT Dx: 250.00  1 each  0  . butalbital-acetaminophen-caffeine (FIORICET) 50-325-40 MG per tablet Take 1-2 tablets by mouth every 6 (six) hours as needed for headache.  20 tablet  0  . cetirizine (ZYRTEC) 10 MG tablet Take 1 tablet (10 mg total) by mouth daily.  30 tablet  11  . clopidogrel (PLAVIX) 75 MG tablet Take 75 mg by mouth daily.      . diazepam (VALIUM) 5 MG tablet Take 1 tablet (5 mg total) by mouth every 12 (twelve) hours as needed for anxiety.  30 tablet  1  . FENOFIBRATE PO Take 135 mg by mouth daily.      Marland Kitchen glucose blood (ACCU-CHEK AVIVA PLUS) test strip Testing TID Daily Dx. 250.00  300 each  1  . glucose blood (TRUETEST TEST) test strip DX: 250.72  100 each  2  . glucose blood test strip accu chek test strips- check tid DX: 250.72  100 each  2  . insulin lispro (HUMALOG) 100 UNIT/ML  injection Inject into the skin. SLIDING SCALE      . levalbuterol (XOPENEX) 1.25 MG/3ML nebulizer solution Take 1.25 mg by nebulization every 4 (four) hours as needed for wheezing.      Marland Kitchen levocetirizine (XYZAL) 5 MG tablet Take 5 mg by mouth every evening.      . meclizine (ANTIVERT) 25 MG tablet Take 25 mg by mouth every 4 (four) hours as needed for dizziness.      . metoprolol succinate (TOPROL-XL) 50 MG 24 hr tablet Take 50 mg by mouth daily. Take with or immediately following a meal.      . montelukast (SINGULAIR) 10 MG tablet Take 1 tablet (10 mg total) by mouth at bedtime.  30 tablet  3  . pantoprazole (PROTONIX) 40 MG tablet Take 40 mg by mouth daily.      . Pitavastatin Calcium (LIVALO) 4 MG TABS Take 1 tablet by mouth at bedtime.      . pregabalin (LYRICA) 50 MG capsule Take 100 mg by mouth 3 (three) times daily.      . promethazine (PHENERGAN) 12.5 MG tablet Take 12.5 mg by mouth every 6 (six) hours as needed for nausea.      . promethazine (PHENERGAN) 25 MG tablet Take 1 tablet (25 mg total) by mouth 2 (two) times daily as needed for nausea or vomiting.  40 tablet  0  . Respiratory Therapy Supplies (FLUTTER) DEVI Use as directed  1 each  0  . rizatriptan (MAXALT-MLT) 5 MG disintegrating tablet Take 1 tablet (5 mg total) by mouth as needed for migraine. May repeat in 2 hours if needed  10 tablet  0  . triamcinolone cream (KENALOG) 0.1 % Apply 1 application topically 2 (two) times daily.      Marland Kitchen venlafaxine (EFFEXOR) 50 MG tablet Take 1 tablet (50 mg total) by mouth 2 (two) times daily with a meal.  60 tablet  1  . verapamil (VERELAN PM) 180 MG 24 hr capsule Take 180 mg by mouth at bedtime.      . fluconazole (DIFLUCAN) 150 MG tablet Take 1 tablet (150 mg total) by mouth once a week.  2 tablet  1   No current facility-administered medications on file prior to visit.    Allergies  Allergen Reactions  . Aspirin   . Atarax [Hydroxyzine]   . Baclofen   . Ciprofloxacin   . Iodine  Tincture [Iodine]   . Isocal [Alitraq]   . Lyrica [Pregabalin]   . Metformin Hcl   . Methocarbamol   . Molds & Smuts   . Neurontin [Gabapentin]   . Nsaids   . Other     Grass and tress  . Penicillins   . Soma [Carisoprodol]     Review of Systems  Review of Systems  Constitutional: Negative for fever and malaise/fatigue.  HENT: Negative for congestion.   Eyes: Negative for discharge.  Respiratory: Negative for shortness of breath.   Cardiovascular: Negative for chest pain, palpitations and leg swelling.  Gastrointestinal: Negative for nausea, abdominal pain and diarrhea.  Genitourinary: Negative for dysuria.  Musculoskeletal: Positive for back pain. Negative for falls.  Skin: Negative for rash.  Neurological: Positive for tingling. Negative for loss of consciousness and headaches.       R leg> L leg  Endo/Heme/Allergies: Negative for polydipsia.  Psychiatric/Behavioral: Negative for depression and suicidal ideas. The patient is not nervous/anxious and does not have insomnia.     Objective  BP 100/68  Pulse 104  Temp(Src) 97.8 F (36.6 C) (Oral)  Ht 5\' 8"  (1.727 m)  Wt 203 lb 1.3 oz (92.116 kg)  BMI 30.89 kg/m2  SpO2 97%  Physical Exam  Physical Exam  Constitutional: She is oriented to person, place, and time and well-developed, well-nourished, and in no distress. No distress.  HENT:  Head: Normocephalic and atraumatic.  Eyes: Conjunctivae are normal.  Neck: Neck supple. No thyromegaly present.  Cardiovascular: Normal rate, regular rhythm and normal heart sounds.   No murmur heard. Pulmonary/Chest: Effort normal and breath sounds normal. She has no wheezes.  Abdominal: She exhibits no distension and no mass.  Musculoskeletal: She exhibits tenderness. She exhibits no edema.  Mild tender with palp lumbar spine  Lymphadenopathy:    She has no cervical adenopathy.  Neurological: She is alert and oriented to person, place, and time.  Skin: Skin is warm and dry. No  rash noted. She is not diaphoretic.  Psychiatric: Memory, affect and judgment normal.    Lab Results  Component Value Date   TSH 1.933 01/29/2013   Lab Results  Component Value Date   WBC 7.5 04/10/2013   HGB 15.8* 04/10/2013   HCT 47.0* 04/10/2013   MCV 86.2 04/10/2013   PLT 363 04/10/2013   Lab Results  Component Value Date   CREATININE 0.69 04/10/2013   BUN 12 04/10/2013   NA 134* 04/10/2013   K 4.4 04/10/2013   CL 96 04/10/2013   CO2 27 04/10/2013   Lab Results  Component Value Date   ALT 11 01/29/2013   AST 13 01/29/2013   ALKPHOS 114 01/29/2013   BILITOT 0.5 01/29/2013   Lab Results  Component Value Date   CHOL 318* 01/29/2013   Lab Results  Component Value Date   HDL 44 01/29/2013   Lab Results  Component Value Date   LDLCALC Comment:   Not calculated due to Triglyceride >400. Suggest ordering Direct LDL (Unit Code: 13/06/2012).   Total Cholesterol/HDL Ratio:CHD Risk                        Coronary Heart Disease Risk Table                                        Men       Women  1/2 Average Risk              3.4        3.3              Average Risk              5.0        4.4           2X Average Risk              9.6        7.1           3X Average Risk             23.4       11.0 Use the calculated Patient Ratio above and the CHD Risk table  to determine the patient's CHD Risk. ATP III Classification (LDL):       < 100        mg/dL         Optimal      100 - 129     mg/dL         Near or Above Optimal      130 - 159     mg/dL         Borderline High      160 - 189     mg/dL         High       > 190        mg/dL         Very High   01/29/2013   Lab Results  Component Value Date   TRIG 588* 01/29/2013   Lab Results  Component Value Date   CHOLHDL 7.2 01/29/2013     Assessment & Plan  Hypertension Well controlled, no changes  LBP (low back pain) Fell on ice 5 days ago and has had an increase in her low bck pain with radiculopathy. xrays negative for acute process but  given history and symptoms is referred to neurosurgery for further care. May use Tramadol prn and seek care if pain worsens.   Kidney stones Now following with urology asymptomatic today, maintain adequate hdyration

## 2013-05-28 ENCOUNTER — Other Ambulatory Visit: Payer: Self-pay | Admitting: Family Medicine

## 2013-05-28 DIAGNOSIS — F329 Major depressive disorder, single episode, unspecified: Secondary | ICD-10-CM

## 2013-05-28 DIAGNOSIS — E785 Hyperlipidemia, unspecified: Secondary | ICD-10-CM

## 2013-05-28 DIAGNOSIS — E119 Type 2 diabetes mellitus without complications: Secondary | ICD-10-CM

## 2013-05-28 DIAGNOSIS — F419 Anxiety disorder, unspecified: Secondary | ICD-10-CM

## 2013-05-28 MED ORDER — LEVALBUTEROL HCL 1.25 MG/3ML IN NEBU: 1.2500 mg | INHALATION_SOLUTION | RESPIRATORY_TRACT | Status: AC | PRN

## 2013-05-28 MED ORDER — PANTOPRAZOLE SODIUM 40 MG PO TBEC: 40.0000 mg | DELAYED_RELEASE_TABLET | Freq: Every day | ORAL | Status: AC

## 2013-05-28 MED ORDER — VENLAFAXINE HCL 50 MG PO TABS: 50.0000 mg | ORAL_TABLET | Freq: Two times a day (BID) | ORAL | Status: AC

## 2013-05-28 MED ORDER — TRIAMCINOLONE ACETONIDE 0.1 % EX CREA: 1.0000 "application " | TOPICAL_CREAM | Freq: Two times a day (BID) | CUTANEOUS | Status: AC

## 2013-05-28 MED ORDER — CETIRIZINE HCL 10 MG PO TABS: 10.0000 mg | ORAL_TABLET | Freq: Every day | ORAL | Status: AC

## 2013-05-28 MED ORDER — PITAVASTATIN CALCIUM 4 MG PO TABS: 1.0000 | ORAL_TABLET | Freq: Every day | ORAL | Status: DC

## 2013-05-28 MED ORDER — LEVALBUTEROL HCL 1.25 MG/3ML IN NEBU: 1.2500 mg | INHALATION_SOLUTION | RESPIRATORY_TRACT | Status: DC | PRN

## 2013-05-28 MED ORDER — LEVOCETIRIZINE DIHYDROCHLORIDE 5 MG PO TABS: 5.0000 mg | ORAL_TABLET | Freq: Every evening | ORAL | Status: DC

## 2013-05-28 MED ORDER — MONTELUKAST SODIUM 10 MG PO TABS: 10.0000 mg | ORAL_TABLET | Freq: Every day | ORAL | Status: AC

## 2013-05-28 MED ORDER — ATORVASTATIN CALCIUM 10 MG PO TABS: 10.0000 mg | ORAL_TABLET | Freq: Every day | ORAL | Status: AC

## 2013-05-28 MED ORDER — MONTELUKAST SODIUM 10 MG PO TABS: 10.0000 mg | ORAL_TABLET | Freq: Every day | ORAL | Status: DC

## 2013-05-28 MED ORDER — INSULIN LISPRO 100 UNIT/ML ~~LOC~~ SOLN: SUBCUTANEOUS | Status: AC

## 2013-05-28 MED ORDER — CLOPIDOGREL BISULFATE 75 MG PO TABS: 75.0000 mg | ORAL_TABLET | Freq: Every day | ORAL | Status: DC

## 2013-05-28 MED ORDER — INSULIN GLARGINE 100 UNIT/ML ~~LOC~~ SOLN: 87.0000 [IU] | Freq: Every day | SUBCUTANEOUS | Status: DC

## 2013-05-28 MED ORDER — METOPROLOL SUCCINATE ER 50 MG PO TB24: 50.0000 mg | ORAL_TABLET | Freq: Every day | ORAL | Status: DC

## 2013-05-28 MED ORDER — INSULIN LISPRO 100 UNIT/ML ~~LOC~~ SOLN: SUBCUTANEOUS | Status: DC

## 2013-05-28 MED ORDER — PANTOPRAZOLE SODIUM 40 MG PO TBEC: 40.0000 mg | DELAYED_RELEASE_TABLET | Freq: Every day | ORAL | Status: DC

## 2013-05-28 MED ORDER — CLOPIDOGREL BISULFATE 75 MG PO TABS: 75.0000 mg | ORAL_TABLET | Freq: Every day | ORAL | Status: AC

## 2013-05-28 MED ORDER — ATORVASTATIN CALCIUM 10 MG PO TABS: 10.0000 mg | ORAL_TABLET | Freq: Every day | ORAL | Status: DC

## 2013-05-28 MED ORDER — CETIRIZINE HCL 10 MG PO TABS: 10.0000 mg | ORAL_TABLET | Freq: Every day | ORAL | Status: DC

## 2013-05-28 MED ORDER — VERAPAMIL HCL ER 180 MG PO CP24
180.0000 mg | ORAL_CAPSULE | Freq: Every day | ORAL | Status: AC
Start: 2013-05-28 — End: ?

## 2013-05-28 MED ORDER — PITAVASTATIN CALCIUM 4 MG PO TABS: 1.0000 | ORAL_TABLET | Freq: Every day | ORAL | Status: AC

## 2013-05-28 MED ORDER — VENLAFAXINE HCL 50 MG PO TABS: 50.0000 mg | ORAL_TABLET | Freq: Two times a day (BID) | ORAL | Status: DC

## 2013-05-28 MED ORDER — VERAPAMIL HCL ER 180 MG PO CP24: 180.0000 mg | ORAL_CAPSULE | Freq: Every day | ORAL | Status: DC

## 2013-05-28 NOTE — Telephone Encounter (Signed)
Patient is requesting a refill on all medications to be sent to mail order pharmacy.

## 2013-05-28 NOTE — Telephone Encounter (Signed)
Can Lyrica be sent for a 90 day supply to mail order

## 2013-05-28 NOTE — Telephone Encounter (Signed)
Pt would like 90 day supply of Antivert, Valium, Lyrica and Phenergan sent to [facility].  Please advise?

## 2013-05-29 ENCOUNTER — Other Ambulatory Visit: Payer: Self-pay | Admitting: Family Medicine

## 2013-05-29 NOTE — Telephone Encounter (Signed)
She can have a 3 month supply of Lyrica with 1 rf. Can have #90 valium no rf, 90 antivert, no refill, 90 Phenergan no rf. I will not do more than that for these since they are all controlled

## 2013-05-30 MED ORDER — MECLIZINE HCL 25 MG PO TABS: 25.0000 mg | ORAL_TABLET | ORAL | Status: AC | PRN

## 2013-05-30 MED ORDER — PROMETHAZINE HCL 25 MG PO TABS: 25.0000 mg | ORAL_TABLET | Freq: Two times a day (BID) | ORAL | Status: DC | PRN

## 2013-05-30 MED ORDER — DIAZEPAM 5 MG PO TABS: 5.0000 mg | ORAL_TABLET | Freq: Two times a day (BID) | ORAL | Status: AC | PRN

## 2013-05-30 MED ORDER — PREGABALIN 50 MG PO CAPS: 100.0000 mg | ORAL_CAPSULE | Freq: Three times a day (TID) | ORAL | Status: AC

## 2013-06-04 ENCOUNTER — Other Ambulatory Visit: Payer: Self-pay | Admitting: Neurosurgery

## 2013-06-04 DIAGNOSIS — IMO0002 Reserved for concepts with insufficient information to code with codable children: Secondary | ICD-10-CM

## 2013-06-05 ENCOUNTER — Telehealth: Payer: Self-pay | Admitting: Family Medicine

## 2013-06-05 DIAGNOSIS — E119 Type 2 diabetes mellitus without complications: Secondary | ICD-10-CM

## 2013-06-05 NOTE — Telephone Encounter (Signed)
Received PA paperwork, form forward to nurse

## 2013-06-10 ENCOUNTER — Other Ambulatory Visit: Payer: Medicare HMO

## 2013-06-11 ENCOUNTER — Telehealth: Payer: Self-pay

## 2013-06-11 NOTE — Telephone Encounter (Signed)
Printed this and put on mds desk along with pa paperwork

## 2013-06-11 NOTE — Telephone Encounter (Signed)
[facility].   Per md we need to know why patient is taking this and for how long? If she has tried anything else and if so how did it fail?   I left a message for patient to return my call or to ask for Nicki Guadalajararicia or Jasmine DecemberSharon if its tomorrow

## 2013-06-11 NOTE — Telephone Encounter (Signed)
She has been taking lyrica for 4 years.  Dr Sherene SiresWert wants her to keep taking it.  It keeps her lungs working.  It helps her diabetic neuropothy.  She tried Neurontin, Flexaril.  She has failed on both of these

## 2013-06-14 ENCOUNTER — Telehealth: Payer: Self-pay | Admitting: Family Medicine

## 2013-06-14 NOTE — Telephone Encounter (Signed)
Please advise 

## 2013-06-14 NOTE — Telephone Encounter (Signed)
Patient states that the pain medication that Dr. Abner GreenspanBlyth gave to her is not helping at all. Patient wanted to know if Dr. Abner GreenspanBlyth would prescribe her something else?

## 2013-06-15 NOTE — Telephone Encounter (Signed)
No I cannot prescribe something else over the phone. I can refer her to pain management if she would like and she can come in to discuss her options.

## 2013-06-17 NOTE — Telephone Encounter (Signed)
Patient informed and voiced understanding. Call was transferred to Marj to schedule

## 2013-06-24 ENCOUNTER — Encounter: Payer: Self-pay | Admitting: Family Medicine

## 2013-06-24 ENCOUNTER — Ambulatory Visit (HOSPITAL_BASED_OUTPATIENT_CLINIC_OR_DEPARTMENT_OTHER)
Admission: RE | Admit: 2013-06-24 | Discharge: 2013-06-24 | Disposition: A | Discharge status: Home / Self Care | Payer: Medicare HMO | Source: Ambulatory Visit | Attending: Family Medicine | Admitting: Family Medicine

## 2013-06-24 ENCOUNTER — Ambulatory Visit (INDEPENDENT_AMBULATORY_CARE_PROVIDER_SITE_OTHER): Payer: Medicare HMO | Admitting: Family Medicine

## 2013-06-24 VITALS — BP 128/76 | HR 99 | Temp 98.0°F | Ht 68.0 in | Wt 194.1 lb

## 2013-06-24 DIAGNOSIS — M549 Dorsalgia, unspecified: Secondary | ICD-10-CM

## 2013-06-24 DIAGNOSIS — I10 Essential (primary) hypertension: Secondary | ICD-10-CM

## 2013-06-24 DIAGNOSIS — M25559 Pain in unspecified hip: Secondary | ICD-10-CM

## 2013-06-24 DIAGNOSIS — M545 Low back pain, unspecified: Secondary | ICD-10-CM

## 2013-06-24 MED ORDER — OXYCODONE-ACETAMINOPHEN 7.5-325 MG PO TABS: 1.0000 | ORAL_TABLET | ORAL | Status: AC | PRN

## 2013-06-24 MED ORDER — METHOCARBAMOL 500 MG PO TABS: 500.0000 mg | ORAL_TABLET | Freq: Three times a day (TID) | ORAL | Status: AC | PRN

## 2013-06-24 NOTE — Patient Instructions (Signed)
She needs the [facility] number of [facility]    Back Pain, Adult Low back pain is very common. About 1 in 5 people have back pain.The cause of low back pain is rarely dangerous. The pain often gets better over time.About half of people with a sudden onset of back pain feel better in just 2 weeks. About 8 in 10 people feel better by 6 weeks.  CAUSES Some common causes of back pain include:  Strain of the muscles or ligaments supporting the spine.  Wear and tear (degeneration) of the spinal discs.  Arthritis.  Direct injury to the back. DIAGNOSIS Most of the time, the direct cause of low back pain is not known.However, back pain can be treated effectively even when the exact cause of the pain is unknown.Answering your caregiver's questions about your overall health and symptoms is one of the most accurate ways to make sure the cause of your pain is not dangerous. If your caregiver needs more information, he or she may order lab work or imaging tests (X-rays or MRIs).However, even if imaging tests show changes in your back, this usually does not require surgery. HOME CARE INSTRUCTIONS For many people, back pain returns.Since low back pain is rarely dangerous, it is often a condition that people can learn to [facility] their own.   Remain active. It is stressful on the back to sit or stand in one place. Do not sit, drive, or stand in one place for more than 30 minutes at a time. Take short walks on level surfaces as soon as pain allows.Try to increase the length of time you walk each day.  Do not stay in bed.Resting more than 1 or 2 days can delay your recovery.  Do not avoid exercise or work.Your body is made to move.It is not dangerous to be active, even though your back may hurt.Your back will likely heal faster if you return to being active before your pain is gone.  Pay attention to your body when you bend and lift. Many people have less discomfortwhen lifting  if they bend their knees, keep the load close to their bodies,and avoid twisting. Often, the most comfortable positions are those that put less stress on your recovering back.  Find a comfortable position to sleep. Use a firm mattress and lie on your side with your knees slightly bent. If you lie on your back, put a pillow under your knees.  Only take over-the-counter or prescription medicines as directed by your caregiver. Over-the-counter medicines to reduce pain and inflammation are often the most helpful.Your caregiver may prescribe muscle relaxant drugs.These medicines help dull your pain so you can more quickly return to your normal activities and healthy exercise.  Put ice on the injured area.  Put ice in a plastic bag.  Place a towel between your skin and the bag.  Leave the ice on for 15-20 minutes, 03-04 times a day for the first 2 to 3 days. After that, ice and heat may be alternated to reduce pain and spasms.  Ask your caregiver about trying back exercises and gentle massage. This may be of some benefit.  Avoid feeling anxious or stressed.Stress increases muscle tension and can worsen back pain.It is important to recognize when you are anxious or stressed and learn ways to manage it.Exercise is a great option. SEEK MEDICAL CARE IF:  You have pain that is not relieved with rest or medicine.  You have pain that does not improve in 1 week.  You have new symptoms.  You are generally not feeling well. SEEK IMMEDIATE MEDICAL CARE IF:   You have pain that radiates from your back into your legs.  You develop new bowel or bladder control problems.  You have unusual weakness or numbness in your arms or legs.  You develop nausea or vomiting.  You develop abdominal pain.  You feel faint. Document Released: 03/14/2005 Document Revised: 09/13/2011 Document Reviewed: 08/02/2010 [facility] Patient Information 2014 [place], [place].

## 2013-06-24 NOTE — Telephone Encounter (Signed)
OK to resubmit patient informed us today she did not respond to Gabapentin in past

## 2013-06-24 NOTE — Telephone Encounter (Signed)
FYI: PA for Lyrica was denied

## 2013-06-25 MED ORDER — INSULIN GLARGINE 100 UNIT/ML ~~LOC~~ SOLN: 87.0000 [IU] | Freq: Every day | SUBCUTANEOUS | Status: AC

## 2013-06-25 MED ORDER — PROMETHAZINE HCL 25 MG PO TABS: 25.0000 mg | ORAL_TABLET | Freq: Two times a day (BID) | ORAL | Status: AC | PRN

## 2013-06-25 MED ORDER — INSULIN GLARGINE 100 UNIT/ML ~~LOC~~ SOLN: 87.0000 [IU] | Freq: Every day | SUBCUTANEOUS | Status: DC

## 2013-06-26 ENCOUNTER — Encounter: Payer: Self-pay | Admitting: Family Medicine

## 2013-06-26 NOTE — Progress Notes (Signed)
Patient ID: Brittany Cannon, female   DOB: 15-Dec-1950, 63 y.o.   MRN: [id number] LEIGHA OLBERDING [id number] [birth year] 06/26/2013      Progress Note-Follow Up  Subjective  Chief Complaint  Chief Complaint  Patient presents with  . follow up    pain med did not work on back pain, pain on the hips for few wks.     HPI  Patient is a 63 year old female in today for routine medical care. She fell several times and ice this past winter and has been struggling with increased low back pain and hip pain since that time. She has radicular symptoms into her legs and a sense of weakness in her upper legs as well. Has followed with pain management and neurosurgery in the past and has had surgery in her low back previously as well. Denies incontinence or falls in the last 2 weeks. No fevers or chills. Denies CP/palp/SOB/HA/congestion/fevers. Taking meds as prescribed  Past Medical History  Diagnosis Date  . Heart palpitations   . Diabetes   . Stroke   . Hyperlipidemia   . Kidney stones   . TMJ (dislocation of temporomandibular joint)   . Asthma   . Hypertension   . Migraine syndrome 02/03/2013  . Allergic state 02/03/2013  . Obesity, unspecified 02/03/2013  . Insomnia 02/03/2013  . Diverticulosis 02/03/2013  . IBS (irritable bowel syndrome) 02/03/2013  . 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 Other and unspecified hyperlipidemia 02/03/2013  . Type II or unspecified type diabetes mellitus with peripheral circulatory disorders, uncontrolled(250.72) 02/03/2013    Past Surgical History  Procedure Laterality Date  . Rotator cuff repair  x 2   . Cholecystectomy    . Colon surgery  x 3   . Vesicovaginal fistula closure w/ tah    . Appendectomy    . Abdominal hysterectomy  1990    total    Family History  Problem Relation Age of Onset  . Emphysema Father     was a smoker  . Heart disease Father   . Heart  attack Father   . Asthma Mother   . Heart disease Mother     catherization  . Cancer Mother     skin   . Hyperlipidemia Mother   . Hypertension Mother   . Heart attack Mother   . Thyroid disease Sister   . Heart disease Brother   . Kidney Stones Brother   . Cancer Sister     breast- milk duct  . Asthma Sister   . Heart disease Brother   . Kidney Stones Brother   . Heart disease Brother   . Heart murmur Brother   . Heart murmur Brother   . Kidney Stones Brother     History   Social History  . Marital Status: Single    Spouse Name: N/A    Number of Children: N/A  . Years of Education: N/A   Occupational History  . Retired     Social History Main Topics  . Smoking status: Never Smoker   . Smokeless tobacco: Never Used  . Alcohol Use: No  . Drug Use: No  . Sexual Activity: No   Other Topics Concern  . Not on file   Social History Narrative  . No narrative on file    Current Outpatient Prescriptions on File Prior to Visit  Medication Sig Dispense Refill  . ACCU-CHEK SOFTCLIX LANCETS lancets Testing  TID Daily Dx:250.00  300 each  1  . atorvastatin (LIPITOR) 10 MG tablet Take 1 tablet (10 mg total) by mouth daily.  90 tablet  1  . Blood Glucose Monitoring Suppl (ACCU-CHEK COMPACT CARE KIT) KIT Dx: 250.00  1 each  0  . butalbital-acetaminophen-caffeine (FIORICET) 50-325-40 MG per tablet Take 1-2 tablets by mouth every 6 (six) hours as needed for headache.  20 tablet  0  . cetirizine (ZYRTEC) 10 MG tablet Take 1 tablet (10 mg total) by mouth daily.  90 tablet  1  . clopidogrel (PLAVIX) 75 MG tablet Take 1 tablet (75 mg total) by mouth daily.  90 tablet  1  . diazepam (VALIUM) 5 MG tablet Take 1 tablet (5 mg total) by mouth every 12 (twelve) hours as needed for anxiety.  90 tablet  0  . FENOFIBRATE PO Take 135 mg by mouth daily.      . fluconazole (DIFLUCAN) 150 MG tablet Take 1 tablet (150 mg total) by mouth once a week.  2 tablet  1  . glucose blood (ACCU-CHEK AVIVA  PLUS) test strip Testing TID Daily Dx. 250.00  300 each  1  . glucose blood (TRUETEST TEST) test strip DX: 250.72  100 each  2  . glucose blood test strip accu chek test strips- check tid DX: 250.72  100 each  2  . insulin lispro (HUMALOG) 100 UNIT/ML injection SLIDING SCALE  30 mL  1  . levalbuterol (XOPENEX) 1.25 MG/3ML nebulizer solution Take 1.25 mg by nebulization every 4 (four) hours as needed for wheezing.  72 mL  1  . levocetirizine (XYZAL) 5 MG tablet Take 1 tablet (5 mg total) by mouth every evening.  90 tablet  1  . meclizine (ANTIVERT) 25 MG tablet Take 1 tablet (25 mg total) by mouth every 4 (four) hours as needed for dizziness.  90 tablet  0  . methylPREDNISolone (MEDROL) 4 MG tablet 5 tabs po x 1 day then 4 tabs po x 1 day then 3 tabs po qd x 3 days and then 2 tabs po daily x 3 days then 1 tab po daily x 3 days  30 tablet  0  . montelukast (SINGULAIR) 10 MG tablet Take 1 tablet (10 mg total) by mouth at bedtime.  90 tablet  1  . pantoprazole (PROTONIX) 40 MG tablet Take 1 tablet (40 mg total) by mouth daily.  90 tablet  1  . Pitavastatin Calcium (LIVALO) 4 MG TABS Take 1 tablet (4 mg total) by mouth at bedtime.  90 tablet  1  . pregabalin (LYRICA) 50 MG capsule Take 2 capsules (100 mg total) by mouth 3 (three) times daily.  540 capsule  1  . Respiratory Therapy Supplies (FLUTTER) DEVI Use as directed  1 each  0  . triamcinolone cream (KENALOG) 0.1 % Apply 1 application topically 2 (two) times daily.  45 g  1  . venlafaxine (EFFEXOR) 50 MG tablet Take 1 tablet (50 mg total) by mouth 2 (two) times daily with a meal.  180 tablet  1  . verapamil (VERELAN PM) 180 MG 24 hr capsule Take 1 capsule (180 mg total) by mouth at bedtime.  90 capsule  1  . insulin glargine (LANTUS) 100 UNIT/ML injection Inject 0.87 mLs (87 Units total) into the skin at bedtime. Send enough for a 90 day supply  60 mL  1  . promethazine (PHENERGAN) 25 MG tablet Take 1 tablet (25 mg total) by mouth 2 (two)  times  daily as needed for nausea or vomiting. Cancel other RX's on file  180 tablet  1   No current facility-administered medications on file prior to visit.    Allergies  Allergen Reactions  . Aspirin   . Atarax [Hydroxyzine]   . Baclofen   . Ciprofloxacin   . Iodine Tincture [Iodine]   . Isocal [Alitraq]   . Lyrica [Pregabalin]   . Metformin Hcl   . Methocarbamol   . Molds & Smuts   . Neurontin [Gabapentin]   . Nsaids   . Other     Grass and tress  . Penicillins   . Soma [Carisoprodol]     Review of Systems  Review of Systems  Constitutional: Negative for fever and malaise/fatigue.  HENT: Negative for congestion.   Eyes: Negative for discharge.  Respiratory: Negative for shortness of breath.   Cardiovascular: Negative for chest pain, palpitations and leg swelling.  Gastrointestinal: Negative for nausea, abdominal pain and diarrhea.  Genitourinary: Negative for dysuria.  Musculoskeletal: Positive for back pain and joint pain. Negative for falls.       Left hip pain  Skin: Negative for rash.  Neurological: Negative for loss of consciousness and headaches.  Endo/Heme/Allergies: Negative for polydipsia.  Psychiatric/Behavioral: Negative for depression and suicidal ideas. The patient is not nervous/anxious and does not have insomnia.     Objective  BP 128/76  Pulse 99  Temp(Src) 98 F (36.7 C) (Oral)  Ht 5' 8"  (1.727 m)  Wt 194 lb 1.9 oz (88.052 kg)  BMI 29.52 kg/m2  SpO2 99%  Physical Exam  Physical Exam  Constitutional: She is oriented to person, place, and time and well-developed, well-nourished, and in no distress. No distress.  HENT:  Head: Normocephalic and atraumatic.  Eyes: Conjunctivae are normal.  Neck: Neck supple. No thyromegaly present.  Cardiovascular: Normal rate, regular rhythm and normal heart sounds.   No murmur heard. Pulmonary/Chest: Effort normal and breath sounds normal. She has no wheezes.  Abdominal: She exhibits no distension and no  mass.  Musculoskeletal: She exhibits no edema.  Lymphadenopathy:    She has no cervical adenopathy.  Neurological: She is alert and oriented to person, place, and time.  Skin: Skin is warm and dry. No rash noted. She is not diaphoretic.  Psychiatric: Memory, affect and judgment normal.    Lab Results  Component Value Date   TSH 1.933 01/29/2013   Lab Results  Component Value Date   WBC 7.5 04/10/2013   HGB 15.8* 04/10/2013   HCT 47.0* 04/10/2013   MCV 86.2 04/10/2013   PLT 363 04/10/2013   Lab Results  Component Value Date   CREATININE 0.69 04/10/2013   BUN 12 04/10/2013   NA 134* 04/10/2013   K 4.4 04/10/2013   CL 96 04/10/2013   CO2 27 04/10/2013   Lab Results  Component Value Date   ALT 11 01/29/2013   AST 13 01/29/2013   ALKPHOS 114 01/29/2013   BILITOT 0.5 01/29/2013   Lab Results  Component Value Date   CHOL 318* 01/29/2013   Lab Results  Component Value Date   HDL 44 01/29/2013   Lab Results  Component Value Date   LDLCALC Comment:   Not calculated due to Triglyceride >400. Suggest ordering Direct LDL (Unit Code: [phone]).   Total Cholesterol/HDL Ratio:CHD Risk                        Coronary Heart Disease Risk Table  Men       Women          1/2 Average Risk              3.4        3.3              Average Risk              5.0        4.4           2X Average Risk              9.6        7.1           3X Average Risk             23.4       11.0 Use the calculated Patient Ratio above and the CHD Risk table  to determine the patient's CHD Risk. ATP III Classification (LDL):       < 100        mg/dL         Optimal      100 - 129     mg/dL         Near or Above Optimal      130 - 159     mg/dL         Borderline High      160 - 189     mg/dL         High       > 190        mg/dL         Very High   01/29/2013   Lab Results  Component Value Date   TRIG 588* 01/29/2013   Lab Results  Component Value Date   CHOLHDL 7.2 01/29/2013      Assessment & Plan  Hypertension Well controlled, no changes to meds. Encouraged heart healthy diet such as the DASH diet and exercise as tolerated.   LBP (low back pain) Significantly worse after several falls on the ice this winter is referred back to the pain clinic she has seen in the past. Left hip pain is worse since fall but xray is for fracture.

## 2013-06-26 NOTE — Assessment & Plan Note (Signed)
Significantly worse after several falls on the ice this winter is referred back to the pain clinic she has seen in the past. Left hip pain is worse since fall but xray is for fracture.

## 2013-06-26 NOTE — Assessment & Plan Note (Signed)
Well controlled, no changes to meds. Encouraged heart healthy diet such as the DASH diet and exercise as tolerated.  °

## 2013-06-27 ENCOUNTER — Telehealth: Payer: Self-pay

## 2013-06-27 NOTE — Telephone Encounter (Signed)
Left a message for pt to return our call.   We received paperwork for [facility] stating that they need to know if pt is taking Zyrtec or Xyzal?

## 2013-07-03 NOTE — Telephone Encounter (Signed)
Medication Clarification form for Lantus faxed back to [facility] to dispense vials per patient/SLS

## 2013-07-03 NOTE — Telephone Encounter (Signed)
Also received fax from [facility] wanting to know if pt is using lantus vials or solostar?  Left message for pt to return my call.

## 2013-07-05 NOTE — Telephone Encounter (Signed)
Per verbal from pt on 07/03/13, she is only taking cetirizine and is buying it OTC.

## 2013-07-05 NOTE — Addendum Note (Signed)
Addended by: Mervin KungFERGERSON, Rahmir Beever A on: 07/05/2013 05:05 PM   Modules accepted: Orders

## 2013-07-12 ENCOUNTER — Other Ambulatory Visit: Payer: Self-pay | Admitting: Family Medicine

## 2013-07-12 NOTE — Telephone Encounter (Signed)
RX faxed

## 2013-07-12 NOTE — Telephone Encounter (Signed)
Please advise refill? Last RX was done on 05-20-13 quantity 90 with 0 refills

## 2013-07-26 ENCOUNTER — Telehealth: Payer: Self-pay | Admitting: Family Medicine

## 2013-07-26 DIAGNOSIS — I10 Essential (primary) hypertension: Secondary | ICD-10-CM

## 2013-07-26 DIAGNOSIS — E785 Hyperlipidemia, unspecified: Secondary | ICD-10-CM

## 2013-07-26 DIAGNOSIS — E1159 Type 2 diabetes mellitus with other circulatory complications: Secondary | ICD-10-CM

## 2013-07-26 NOTE — Telephone Encounter (Signed)
Patient states that she will be going to [facility] on [address] in [place] for labs

## 2013-07-28 NOTE — Telephone Encounter (Signed)
Cbc, liver, renal, tsh, hgba1c, lipid for htn, hyperlipidemia and diabetes

## 2013-07-29 ENCOUNTER — Ambulatory Visit: Payer: Medicare HMO | Admitting: Family Medicine

## 2013-07-29 ENCOUNTER — Telehealth: Payer: Self-pay

## 2013-07-29 NOTE — Telephone Encounter (Signed)
Faxed orders to [facility]

## 2013-07-29 NOTE — Telephone Encounter (Signed)
Mailed order

## 2013-08-02 ENCOUNTER — Other Ambulatory Visit: Payer: Self-pay | Admitting: Family Medicine

## 2013-08-02 NOTE — Telephone Encounter (Signed)
Please advise?  Doesn't look like its time yet? Last office visit was 07-12-13 quantity 90 with 0 refills

## 2013-08-03 ENCOUNTER — Other Ambulatory Visit: Payer: Self-pay | Admitting: Family Medicine

## 2013-08-03 NOTE — Telephone Encounter (Signed)
I will sign it but she cannot pick up til Thursday.

## 2013-08-05 NOTE — Telephone Encounter (Signed)
Please advise refill?  If ok fax to [phone]

## 2013-08-06 NOTE — Telephone Encounter (Signed)
rx faxed

## 2013-08-27 ENCOUNTER — Ambulatory Visit: Payer: Medicare HMO | Admitting: Family Medicine

## 2013-10-08 ENCOUNTER — Telehealth: Payer: Self-pay

## 2013-10-08 NOTE — Telephone Encounter (Signed)
Diabetic Bundle- I called patient and she states that she is no longer with Dr Abner GreenspanBlyth

## 2013-10-11 ENCOUNTER — Telehealth: Payer: Self-pay | Admitting: Family Medicine

## 2013-10-11 NOTE — Telephone Encounter (Signed)
Requesting on Accu-check aviva plus test strip, test 3 times a day QTY 100  Please fax into [facility] @ [phone] or call [phone]

## 2013-10-11 NOTE — Telephone Encounter (Signed)
Pt no longer is Dr Elby ShowersBlyths patient

## 2013-10-29 ENCOUNTER — Telehealth: Payer: Self-pay | Admitting: Family Medicine

## 2013-10-29 NOTE — Telephone Encounter (Signed)
Received PA for lyrica, pt is no longer a pt of Dr Mariel AloeBlyth's

## 2015-05-11 IMAGING — CR DG CHEST 2V
2 series · 2 of 2 positions shown · non-contrast
Comparison: 07/07/2010

CLINICAL DATA: Chest pain and cough for a month.  Nonsmoker.

EXAM:
CHEST  2 VIEW

[w chest pa]
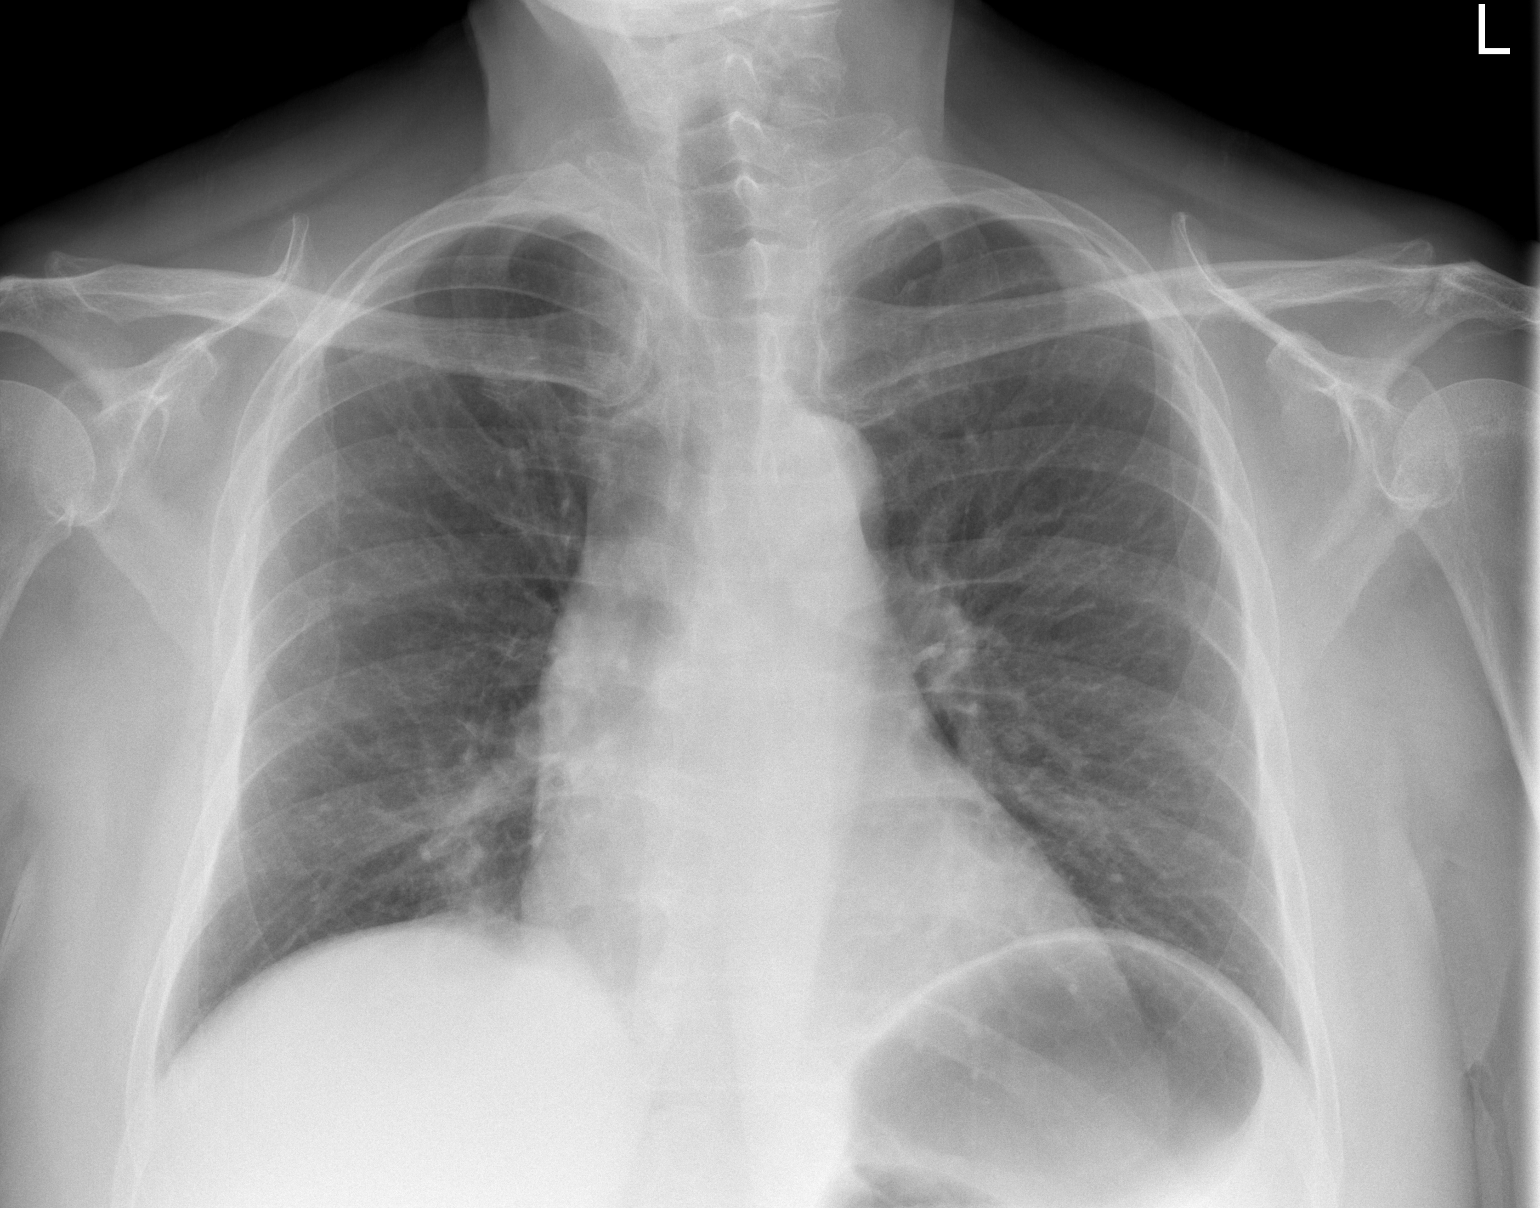

[w chest lat]
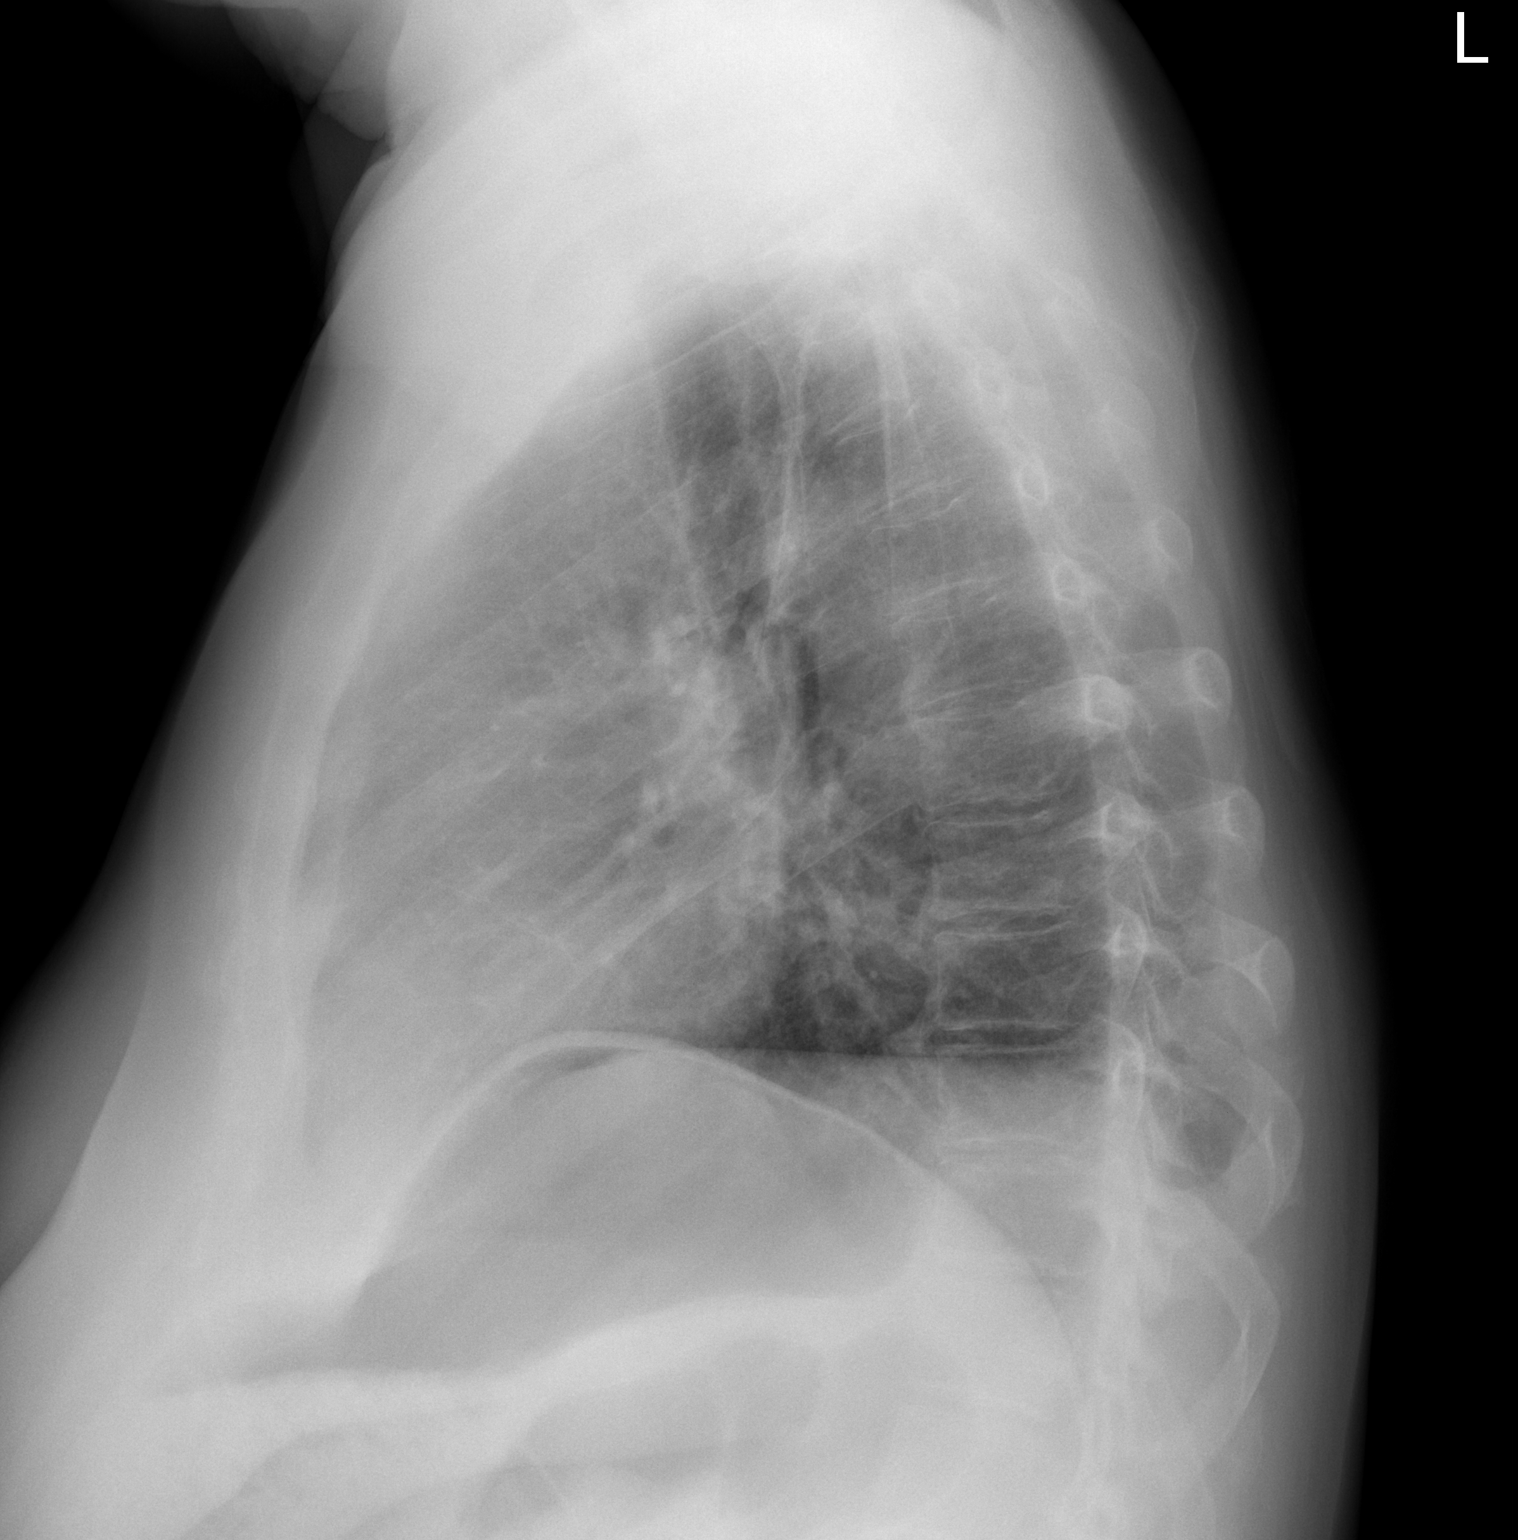

[2 of 2 positions shown; findings below may reference images not displayed]

FINDINGS: Heart size is normal. The lungs are free of focal consolidations and
pleural effusions. No pulmonary edema. Visualized osseous structures
have a normal appearance.
IMPRESSION: No active cardiopulmonary disease.

## 2019-11-27 DEATH — deceased
